# Patient Record
Sex: Female | Born: 1980 | Race: White | Hispanic: No | Marital: Married | Smoking: Never smoker
Health system: Southern US, Community
[De-identification: ages and names within clinical notes are randomized; demographics above are authoritative.]

## PROBLEM LIST (undated history)

## (undated) DIAGNOSIS — F419 Anxiety disorder, unspecified: Secondary | ICD-10-CM

## (undated) DIAGNOSIS — D6851 Activated protein C resistance: Secondary | ICD-10-CM

## (undated) DIAGNOSIS — F32A Depression, unspecified: Secondary | ICD-10-CM

## (undated) DIAGNOSIS — F329 Major depressive disorder, single episode, unspecified: Secondary | ICD-10-CM

## (undated) HISTORY — PX: INTRAUTERINE DEVICE (IUD) INSERTION: SHX5877

## (undated) HISTORY — PX: NO PAST SURGERIES: SHX2092

## (undated) HISTORY — DX: Anxiety disorder, unspecified: F41.9

## (undated) HISTORY — DX: Activated protein C resistance: D68.51

## (undated) HISTORY — DX: Depression, unspecified: F32.A

---
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
## 2018-08-08 ENCOUNTER — Encounter: Payer: Self-pay | Admitting: Family

## 2018-08-08 ENCOUNTER — Ambulatory Visit: Payer: BLUE CROSS/BLUE SHIELD | Admitting: Family

## 2018-08-08 VITALS — BP 102/78 | HR 78 | Temp 98.3°F | Ht 70.0 in | Wt 167.4 lb

## 2018-08-08 DIAGNOSIS — D6851 Activated protein C resistance: Secondary | ICD-10-CM | POA: Diagnosis not present

## 2018-08-08 DIAGNOSIS — F320 Major depressive disorder, single episode, mild: Secondary | ICD-10-CM

## 2018-08-08 DIAGNOSIS — F411 Generalized anxiety disorder: Secondary | ICD-10-CM | POA: Insufficient documentation

## 2018-08-08 MED ORDER — SERTRALINE HCL 50 MG PO TABS: 50.0000 mg | ORAL_TABLET | Freq: Every day | ORAL | 3 refills | Status: DC

## 2018-08-08 NOTE — Assessment & Plan Note (Signed)
Trial zoloft. Close follow up.

## 2018-08-08 NOTE — Patient Instructions (Addendum)
Trial on zoloft.   Very nice to meet you.   Our hope is for gradual improvement of mood since starting medication; however this may take several weeks.   If you start to have unusual thoughts, thoughts of hurting yourself, or anyone else, please go immediately to the emergency department.   Follow up in 6 -8 weeks.    National Suicide Prevention Hotline - available 24 hours a day, 7 days a week.  (931)563-7093  Major Depressive Disorder Major depressive disorder is a mental illness. It also may be called clinical depression or unipolar depression. Major depressive disorder usually causes feelings of sadness, hopelessness, or helplessness. Some people with this disorder do not feel particularly sad but lose interest in doing things they used to enjoy (anhedonia). Major depressive disorder also can cause physical symptoms. It can interfere with work, school, relationships, and other normal everyday activities. The disorder varies in severity but is longer lasting and more serious than the sadness we all feel from time to time in our lives. Major depressive disorder often is triggered by stressful life events or major life changes. Examples of these triggers include divorce, loss of your job or home, a move, and the death of a family member or close friend. Sometimes this disorder occurs for no obvious reason at all. People who have family members with major depressive disorder or bipolar disorder are at higher risk for developing this disorder, with or without life stressors. Major depressive disorder can occur at any age. It may occur just once in your life (single episode major depressive disorder). It may occur multiple times (recurrent major depressive disorder). SYMPTOMS People with major depressive disorder have either anhedonia or depressed mood on nearly a daily basis for at least 2 weeks or longer. Symptoms of depressed mood include:  Feelings of sadness (blue or down in the dumps) or  emptiness.  Feelings of hopelessness or helplessness.  Tearfulness or episodes of crying (may be observed by others).  Irritability (children and adolescents). In addition to depressed mood or anhedonia or both, people with this disorder have at least four of the following symptoms:  Difficulty sleeping or sleeping too much.   Significant change (increase or decrease) in appetite or weight.   Lack of energy or motivation.  Feelings of guilt and worthlessness.   Difficulty concentrating, remembering, or making decisions.  Unusually slow movement (psychomotor retardation) or restlessness (as observed by others).   Recurrent wishes for death, recurrent thoughts of self-harm (suicide), or a suicide attempt. People with major depressive disorder commonly have persistent negative thoughts about themselves, other people, and the world. People with severe major depressive disorder may experiencedistorted beliefs or perceptions about the world (psychotic delusions). They also may see or hear things that are not real (psychotic hallucinations). DIAGNOSIS Major depressive disorder is diagnosed through an assessment by your health care provider. Your health care provider will ask aboutaspects of your daily life, such as mood,sleep, and appetite, to see if you have the diagnostic symptoms of major depressive disorder. Your health care provider may ask about your medical history and use of alcohol or drugs, including prescription medicines. Your health care provider also may do a physical exam and blood work. This is because certain medical conditions and the use of certain substances can cause major depressive disorder-like symptoms (secondary depression). Your health care provider also may refer you to a mental health specialist for further evaluation and treatment. TREATMENT It is important to recognize the symptoms of major depressive  disorder and seek treatment. The following treatments can  be prescribed for this disorder:   Medicine. Antidepressant medicines usually are prescribed. Antidepressant medicines are thought to correct chemical imbalances in the brain that are commonly associated with major depressive disorder. Other types of medicine may be added if the symptoms do not respond to antidepressant medicines alone or if psychotic delusions or hallucinations occur.  Talk therapy. Talk therapy can be helpful in treating major depressive disorder by providing support, education, and guidance. Certain types of talk therapy also can help with negative thinking (cognitive behavioral therapy) and with relationship issues that trigger this disorder (interpersonal therapy). A mental health specialist can help determine which treatment is best for you. Most people with major depressive disorder do well with a combination of medicine and talk therapy. Treatments involving electrical stimulation of the brain can be used in situations with extremely severe symptoms or when medicine and talk therapy do not work over time. These treatments include electroconvulsive therapy, transcranial magnetic stimulation, and vagal nerve stimulation.   This information is not intended to replace advice given to you by your health care provider. Make sure you discuss any questions you have with your health care provider.   Document Released: 11/05/2012 Document Revised: 08/01/2014 Document Reviewed: 11/05/2012 Elsevier Interactive Patient Education [employer].

## 2018-08-08 NOTE — Progress Notes (Signed)
Subjective:    Patient ID: Julia Nolan, female    DOB: 03/29/1981, 38 y.o.   MRN: [id number]  CC: Julia Nolan is a 38 y.o. female who presents today to establish care.    HPI: Describes long history of depression.  Started inHer 35s when her husband was deployed, she first notieced that she was depressed.  At that time she was treated with Celexa, she thinks it was effective at that time.  She  never pursued any counseling.   No history of suicide attempts, hospitalizations for mental illness.  No current suicide ideation, or thoughts of hurting anyone else.   Trouble sleeping and she suspects her mind racing is part of that.  Husband starts MBA school in 1 month, and she knows it she will have more on her plate then.   No panic attacks.   History of factor V Leiden.  Father and brothers( 3) Factor V.  Currently taking a baby aspirin 10 mg once a day. ASA 81mg .  No bleeding, bruising.  No h/o DVT, PE.   No OCP, patient is non-smoker.         HISTORY:  History reviewed. No pertinent past medical history. History reviewed. No pertinent surgical history. Family History  Problem Relation Age of Onset   Depression Mother    Hyperlipidemia Mother    Hyperlipidemia Father    Depression Father    Cancer Father    Depression Sister    Stroke Maternal Grandmother    Arthritis Maternal Grandfather    Diabetes Maternal Grandfather    Hyperlipidemia Paternal Grandmother    Hyperlipidemia Paternal Grandfather    Hypertension Paternal Grandfather    Kidney disease Paternal Grandfather     Allergies: Patient has no known allergies. Current Outpatient Medications on File Prior to Visit  Medication Sig Dispense Refill   aspirin EC 81 MG tablet Take 81 mg by mouth daily.     Probiotic Product (PROBIOTIC-10 PO) Take by mouth.     No current facility-administered medications on file prior to visit.     Social History   Tobacco Use   Smoking status: Never Smoker    Smokeless tobacco: Never Used  Substance Use Topics   Alcohol use: Yes   Drug use: Never    Review of Systems  Constitutional: Negative for chills and fever.  Respiratory: Negative for cough.   Cardiovascular: Negative for chest pain and palpitations.  Gastrointestinal: Negative for nausea and vomiting.  Psychiatric/Behavioral: Positive for sleep disturbance. Negative for suicidal ideas. The patient is not nervous/anxious.       Objective:    BP 102/78 (BP Location: Left Arm, Patient Position: Sitting, Cuff Size: Normal)    Pulse 78    Temp 98.3 F (36.8 C)    Ht 5\' 10"  (1.778 m)    Wt 167 lb 6.4 oz (75.9 kg)    SpO2 97%    BMI 24.02 kg/m  BP Readings from Last 3 Encounters:  08/08/18 102/78   Wt Readings from Last 3 Encounters:  08/08/18 167 lb 6.4 oz (75.9 kg)    Physical Exam Vitals signs reviewed.  Constitutional:      Appearance: She is well-developed.  Eyes:     Conjunctiva/sclera: Conjunctivae normal.  Cardiovascular:     Rate and Rhythm: Normal rate and regular rhythm.     Pulses: Normal pulses.     Heart sounds: Normal heart sounds.  Pulmonary:     Effort: Pulmonary effort is normal.  Breath sounds: Normal breath sounds. No wheezing, rhonchi or rales.  Skin:    General: Skin is warm and dry.  Neurological:     Mental Status: She is alert.  Psychiatric:        Speech: Speech normal.        Behavior: Behavior normal.        Thought Content: Thought content normal.        Assessment & Plan:   Problem List Items Addressed This Visit      Hematopoietic and Hemostatic   Factor V Leiden mutation ([place])    Currently on 81mg  ASA. No h/o dvt, pe. Discussed lifestyle measures, healthy weight, avoidance long periods of inmobility. She is a nonsmoker. Declines hematology consult at this time.          Other   Depression, major, single episode, mild ([place]) - Primary    Trial zoloft. Close follow up.       Relevant Medications   sertraline (ZOLOFT)  50 MG tablet       I am having Julia Nolan start on sertraline. I am also having her maintain her aspirin EC and Probiotic Product (PROBIOTIC-10 PO).   Meds ordered this encounter  Medications   sertraline (ZOLOFT) 50 MG tablet    Sig: Take 1 tablet (50 mg total) by mouth at bedtime.    Dispense:  90 tablet    Refill:  3    Order Specific Question:   Supervising Provider    Answer:   Julia Nolan [[id number]]    Return precautions given.   Risks, benefits, and alternatives of the medications and treatment plan prescribed today were discussed, and patient expressed understanding.   Education regarding symptom management and diagnosis given to patient on AVS.  Continue to follow with Julia Hawthorne, FNP for routine health maintenance.   Julia Nolan and I agreed with plan.   Julia Paris, FNP

## 2018-08-08 NOTE — Assessment & Plan Note (Signed)
Currently on 81mg  ASA. No h/o dvt, pe. Discussed lifestyle measures, healthy weight, avoidance long periods of inmobility. She is a nonsmoker. Declines hematology consult at this time.

## 2018-08-30 ENCOUNTER — Other Ambulatory Visit: Payer: Self-pay | Admitting: Family

## 2018-08-30 DIAGNOSIS — F320 Major depressive disorder, single episode, mild: Secondary | ICD-10-CM

## 2018-09-12 ENCOUNTER — Ambulatory Visit: Payer: BLUE CROSS/BLUE SHIELD | Admitting: Internal Medicine

## 2018-09-12 ENCOUNTER — Encounter: Payer: Self-pay | Admitting: Internal Medicine

## 2018-09-12 VITALS — BP 126/68 | HR 71 | Temp 98.5°F | Ht 70.0 in | Wt 165.6 lb

## 2018-09-12 DIAGNOSIS — J069 Acute upper respiratory infection, unspecified: Secondary | ICD-10-CM

## 2018-09-12 DIAGNOSIS — J329 Chronic sinusitis, unspecified: Secondary | ICD-10-CM | POA: Diagnosis not present

## 2018-09-12 DIAGNOSIS — J029 Acute pharyngitis, unspecified: Secondary | ICD-10-CM

## 2018-09-12 DIAGNOSIS — R6889 Other general symptoms and signs: Secondary | ICD-10-CM

## 2018-09-12 LAB — POC INFLUENZA A&B (BINAX/QUICKVUE)
Influenza A, POC: NEGATIVE
Influenza B, POC: NEGATIVE

## 2018-09-12 MED ORDER — HYDROCOD POLST-CPM POLST ER 10-8 MG/5ML PO SUER: 5.0000 mL | Freq: Every evening | ORAL | 0 refills | Status: DC | PRN

## 2018-09-12 MED ORDER — AZITHROMYCIN 250 MG PO TABS: ORAL_TABLET | ORAL | 0 refills | Status: DC

## 2018-09-12 NOTE — Progress Notes (Signed)
Pre visit review using our clinic review tool, if applicable. No additional management support is needed unless otherwise documented below in the visit note. °

## 2018-09-12 NOTE — Patient Instructions (Signed)
Sore Throat A sore throat is pain, burning, irritation, or scratchiness in the throat. When you have a sore throat, you may feel pain or tenderness in your throat when you swallow or talk. Many things can cause a sore throat, including:  An infection.  Seasonal allergies.  Dryness in the air.  Irritants, such as smoke or pollution.  Radiation treatment to the area.  Gastroesophageal reflux disease (GERD).  A tumor. A sore throat is often the first sign of another sickness. It may happen with other symptoms, such as coughing, sneezing, fever, and swollen neck glands. Most sore throats go away without medical treatment. Follow these instructions at home:      Take over-the-counter medicines only as told by your health care provider. ? If your child has a sore throat, do not give your child aspirin because of the association with Reye syndrome.  Drink enough fluids to keep your urine pale yellow.  Rest as needed.  To help with pain, try: ? Sipping warm liquids, such as broth, herbal tea, or warm water. ? Eating or drinking cold or frozen liquids, such as frozen ice pops. ? Gargling with a salt-water mixture 3-4 times a day or as needed. To make a salt-water mixture, completely dissolve -1 tsp (3-6 g) of salt in 1 cup (237 mL) of warm water. ? Sucking on hard candy or throat lozenges. ? Putting a cool-mist humidifier in your bedroom at night to moisten the air. ? Sitting in the bathroom with the door closed for 5-10 minutes while you run hot water in the shower.  Do not use any products that contain nicotine or tobacco, such as cigarettes, e-cigarettes, and chewing tobacco. If you need help quitting, ask your health care provider.  Wash your hands well and often with soap and water. If soap and water are not available, use hand sanitizer. Contact a health care provider if:  You have a fever for more than 2-3 days.  You have symptoms that last (are persistent) for more than  2-3 days.  Your throat does not get better within 7 days.  You have a fever and your symptoms suddenly get worse.  Your child who is 3 months to 5 years old has a temperature of 102.57F (39C) or higher. Get help right away if:  You have difficulty breathing.  You cannot swallow fluids, soft foods, or your saliva.  You have increased swelling in your throat or neck.  You have persistent nausea and vomiting. Summary  A sore throat is pain, burning, irritation, or scratchiness in the throat. Many things can cause a sore throat.  Take over-the-counter medicines only as told by your health care provider. Do not give your child aspirin.  Drink plenty of fluids, and rest as needed.  Contact a health care provider if your symptoms worsen or your sore throat does not get better within 7 days. This information is not intended to replace advice given to you by your health care provider. Make sure you discuss any questions you have with your health care provider. Document Released: 08/18/2004 Document Revised: 12/11/2017 Document Reviewed: 12/11/2017 Elsevier Interactive Patient Education  2019 [employer]  Sinusitis, Adult Sinusitis is inflammation of your sinuses. Sinuses are hollow spaces in the bones around your face. Your sinuses are located:  Around your eyes.  In the middle of your forehead.  Behind your nose.  In your cheekbones. Mucus normally drains out of your sinuses. When your nasal tissues become inflamed or swollen, mucus  can become trapped or blocked. This allows bacteria, viruses, and fungi to grow, which leads to infection. Most infections of the sinuses are caused by a virus. Sinusitis can develop quickly. It can last for up to 4 weeks (acute) or for more than 12 weeks (chronic). Sinusitis often develops after a cold. What are the causes? This condition is caused by anything that creates swelling in the sinuses or stops mucus from draining. This  includes:  Allergies.  Asthma.  Infection from bacteria or viruses.  Deformities or blockages in your nose or sinuses.  Abnormal growths in the nose (nasal polyps).  Pollutants, such as chemicals or irritants in the air.  Infection from fungi (rare). What increases the risk? You are more likely to develop this condition if you:  Have a weak body defense system (immune system).  Do a lot of swimming or diving.  Overuse nasal sprays.  Smoke. What are the signs or symptoms? The main symptoms of this condition are pain and a feeling of pressure around the affected sinuses. Other symptoms include:  Stuffy nose or congestion.  Thick drainage from your nose.  Swelling and warmth over the affected sinuses.  Headache.  Upper toothache.  A cough that may get worse at night.  Extra mucus that collects in the throat or the back of the nose (postnasal drip).  Decreased sense of smell and taste.  Fatigue.  A fever.  Sore throat.  Bad breath. How is this diagnosed? This condition is diagnosed based on:  Your symptoms.  Your medical history.  A physical exam.  Tests to find out if your condition is acute or chronic. This may include: ? Checking your nose for nasal polyps. ? Viewing your sinuses using a device that has a light (endoscope). ? Testing for allergies or bacteria. ? Imaging tests, such as an MRI or CT scan. In rare cases, a bone biopsy may be done to rule out more serious types of fungal sinus disease. How is this treated? Treatment for sinusitis depends on the cause and whether your condition is chronic or acute.  If caused by a virus, your symptoms should go away on their own within 10 days. You may be given medicines to relieve symptoms. They include: ? Medicines that shrink swollen nasal passages (topical intranasal decongestants). ? Medicines that treat allergies (antihistamines). ? A spray that eases inflammation of the nostrils (topical  intranasal corticosteroids). ? Rinses that help get rid of thick mucus in your nose (nasal saline washes).  If caused by bacteria, your health care provider may recommend waiting to see if your symptoms improve. Most bacterial infections will get better without antibiotic medicine. You may be given antibiotics if you have: ? A severe infection. ? A weak immune system.  If caused by narrow nasal passages or nasal polyps, you may need to have surgery. Follow these instructions at home: Medicines  Take, use, or apply over-the-counter and prescription medicines only as told by your health care provider. These may include nasal sprays.  If you were prescribed an antibiotic medicine, take it as told by your health care provider. Do not stop taking the antibiotic even if you start to feel better. Hydrate and humidify   Drink enough fluid to keep your urine pale yellow. Staying hydrated will help to thin your mucus.  Use a cool mist humidifier to keep the humidity level in your home above 50%.  Inhale steam for 10-15 minutes, 3-4 times a day, or as told by your  health care provider. You can do this in the bathroom while a hot shower is running.  Limit your exposure to cool or dry air. Rest  Rest as much as possible.  Sleep with your head raised (elevated).  Make sure you get enough sleep each night. General instructions   Apply a warm, moist washcloth to your face 3-4 times a day or as told by your health care provider. This will help with discomfort.  Wash your hands often with soap and water to reduce your exposure to germs. If soap and water are not available, use hand sanitizer.  Do not smoke. Avoid being around people who are smoking (secondhand smoke).  Keep all follow-up visits as told by your health care provider. This is important. Contact a health care provider if:  You have a fever.  Your symptoms get worse.  Your symptoms do not improve within 10 days. Get help  right away if:  You have a severe headache.  You have persistent vomiting.  You have severe pain or swelling around your face or eyes.  You have vision problems.  You develop confusion.  Your neck is stiff.  You have trouble breathing. Summary  Sinusitis is soreness and inflammation of your sinuses. Sinuses are hollow spaces in the bones around your face.  This condition is caused by nasal tissues that become inflamed or swollen. The swelling traps or blocks the flow of mucus. This allows bacteria, viruses, and fungi to grow, which leads to infection.  If you were prescribed an antibiotic medicine, take it as told by your health care provider. Do not stop taking the antibiotic even if you start to feel better.  Keep all follow-up visits as told by your health care provider. This is important. This information is not intended to replace advice given to you by your health care provider. Make sure you discuss any questions you have with your health care provider. Document Released: 07/11/2005 Document Revised: 12/11/2017 Document Reviewed: 12/11/2017 Elsevier Interactive Patient Education  2019 [employer]  Sinusitis, Adult Sinusitis is inflammation of your sinuses. Sinuses are hollow spaces in the bones around your face. Your sinuses are located:  Around your eyes.  In the middle of your forehead.  Behind your nose.  In your cheekbones. Mucus normally drains out of your sinuses. When your nasal tissues become inflamed or swollen, mucus can become trapped or blocked. This allows bacteria, viruses, and fungi to grow, which leads to infection. Most infections of the sinuses are caused by a virus. Sinusitis can develop quickly. It can last for up to 4 weeks (acute) or for more than 12 weeks (chronic). Sinusitis often develops after a cold. What are the causes? This condition is caused by anything that creates swelling in the sinuses or stops mucus from draining. This  includes:  Allergies.  Asthma.  Infection from bacteria or viruses.  Deformities or blockages in your nose or sinuses.  Abnormal growths in the nose (nasal polyps).  Pollutants, such as chemicals or irritants in the air.  Infection from fungi (rare). What increases the risk? You are more likely to develop this condition if you:  Have a weak body defense system (immune system).  Do a lot of swimming or diving.  Overuse nasal sprays.  Smoke. What are the signs or symptoms? The main symptoms of this condition are pain and a feeling of pressure around the affected sinuses. Other symptoms include:  Stuffy nose or congestion.  Thick drainage from your nose.  Swelling and warmth over the affected sinuses.  Headache.  Upper toothache.  A cough that may get worse at night.  Extra mucus that collects in the throat or the back of the nose (postnasal drip).  Decreased sense of smell and taste.  Fatigue.  A fever.  Sore throat.  Bad breath. How is this diagnosed? This condition is diagnosed based on:  Your symptoms.  Your medical history.  A physical exam.  Tests to find out if your condition is acute or chronic. This may include: ? Checking your nose for nasal polyps. ? Viewing your sinuses using a device that has a light (endoscope). ? Testing for allergies or bacteria. ? Imaging tests, such as an MRI or CT scan. In rare cases, a bone biopsy may be done to rule out more serious types of fungal sinus disease. How is this treated? Treatment for sinusitis depends on the cause and whether your condition is chronic or acute.  If caused by a virus, your symptoms should go away on their own within 10 days. You may be given medicines to relieve symptoms. They include: ? Medicines that shrink swollen nasal passages (topical intranasal decongestants). ? Medicines that treat allergies (antihistamines). ? A spray that eases inflammation of the nostrils (topical  intranasal corticosteroids). ? Rinses that help get rid of thick mucus in your nose (nasal saline washes).  If caused by bacteria, your health care provider may recommend waiting to see if your symptoms improve. Most bacterial infections will get better without antibiotic medicine. You may be given antibiotics if you have: ? A severe infection. ? A weak immune system.  If caused by narrow nasal passages or nasal polyps, you may need to have surgery. Follow these instructions at home: Medicines  Take, use, or apply over-the-counter and prescription medicines only as told by your health care provider. These may include nasal sprays.  If you were prescribed an antibiotic medicine, take it as told by your health care provider. Do not stop taking the antibiotic even if you start to feel better. Hydrate and humidify   Drink enough fluid to keep your urine pale yellow. Staying hydrated will help to thin your mucus.  Use a cool mist humidifier to keep the humidity level in your home above 50%.  Inhale steam for 10-15 minutes, 3-4 times a day, or as told by your health care provider. You can do this in the bathroom while a hot shower is running.  Limit your exposure to cool or dry air. Rest  Rest as much as possible.  Sleep with your head raised (elevated).  Make sure you get enough sleep each night. General instructions   Apply a warm, moist washcloth to your face 3-4 times a day or as told by your health care provider. This will help with discomfort.  Wash your hands often with soap and water to reduce your exposure to germs. If soap and water are not available, use hand sanitizer.  Do not smoke. Avoid being around people who are smoking (secondhand smoke).  Keep all follow-up visits as told by your health care provider. This is important. Contact a health care provider if:  You have a fever.  Your symptoms get worse.  Your symptoms do not improve within 10 days. Get help  right away if:  You have a severe headache.  You have persistent vomiting.  You have severe pain or swelling around your face or eyes.  You have vision problems.  You develop confusion.  Your neck is stiff.  You have trouble breathing. Summary  Sinusitis is soreness and inflammation of your sinuses. Sinuses are hollow spaces in the bones around your face.  This condition is caused by nasal tissues that become inflamed or swollen. The swelling traps or blocks the flow of mucus. This allows bacteria, viruses, and fungi to grow, which leads to infection.  If you were prescribed an antibiotic medicine, take it as told by your health care provider. Do not stop taking the antibiotic even if you start to feel better.  Keep all follow-up visits as told by your health care provider. This is important. This information is not intended to replace advice given to you by your health care provider. Make sure you discuss any questions you have with your health care provider. Document Released: 07/11/2005 Document Revised: 12/11/2017 Document Reviewed: 12/11/2017 Elsevier Interactive Patient Education  2019 [employer].

## 2018-09-12 NOTE — Progress Notes (Signed)
Chief Complaint  Patient presents with  . Cough  . URI   F/u >1.5 weeks c/o cough, body aches, stuffy nose, h/a, hoarse voice last week now better Monday Sunday achy/h/a Advil helps. Also c/o sore/scratchy throat/PND. No sinus pressure last week tried Mucinex, delsym, honey cough syrup not helping. No h/o asthma    Review of Systems  Constitutional: Negative for fever.  HENT: Positive for sinus pain and sore throat. Negative for hearing loss.   Eyes: Negative for blurred vision.  Respiratory: Positive for cough and sputum production. Negative for wheezing.   Cardiovascular: Negative for chest pain.  Musculoskeletal: Positive for myalgias.  Skin: Negative for rash.   History reviewed. No pertinent past medical history. History reviewed. No pertinent surgical history. Family History  Problem Relation Age of Onset  . Depression Mother   . Hyperlipidemia Mother   . Hyperlipidemia Father   . Depression Father   . Cancer Father   . Depression Sister   . Stroke Maternal Grandmother   . Arthritis Maternal Grandfather   . Diabetes Maternal Grandfather   . Hyperlipidemia Paternal Grandmother   . Hyperlipidemia Paternal Grandfather   . Hypertension Paternal Grandfather   . Kidney disease Paternal Grandfather    Social History   Socioeconomic History  . Marital status: Married    Spouse name: Ailene Laukaitis  . Number of children: 3  . Years of education: Not on file  . Highest education level: Bachelor's degree (e.g., BA, AB, BS)  Occupational History  . Not on file  Social Needs  . Financial resource strain: Not on file  . Food insecurity:    Worry: Not on file    Inability: Not on file  . Transportation needs:    Medical: Not on file    Non-medical: Not on file  Tobacco Use  . Smoking status: Never Smoker  . Smokeless tobacco: Never Used  Substance and Sexual Activity  . Alcohol use: Yes  . Drug use: Never  . Sexual activity: Yes  Lifestyle  . Physical activity:     Days per week: Not on file    Minutes per session: Not on file  . Stress: Not on file  Relationships  . Social connections:    Talks on phone: Not on file    Gets together: Not on file    Attends religious service: Not on file    Active member of club or organization: Not on file    Attends meetings of clubs or organizations: Not on file    Relationship status: Not on file  . Intimate partner violence:    Fear of current or ex partner: Not on file    Emotionally abused: Not on file    Physically abused: Not on file    Forced sexual activity: Not on file  Other Topics Concern  . Not on file  Social History Narrative   Moved here from [place].       Stay at home mom      8, 5, 4- boys.    Current Meds  Medication Sig  . aspirin EC 81 MG tablet Take 81 mg by mouth daily.  . Probiotic Product (PROBIOTIC-10 PO) Take by mouth.  . sertraline (ZOLOFT) 50 MG tablet TAKE 1 TABLET BY MOUTH EVERYDAY AT BEDTIME   No Known Allergies Recent Results (from the past 2160 hour(s))  POC Influenza A&B(BINAX/QUICKVUE)     Status: None   Collection Time: 09/12/18 11:47 AM  Result Value Ref Range  Influenza A, POC Negative Negative   Influenza B, POC Negative Negative   Objective  Body mass index is 23.76 kg/m. Wt Readings from Last 3 Encounters:  09/12/18 165 lb 9.6 oz (75.1 kg)  08/08/18 167 lb 6.4 oz (75.9 kg)   Temp Readings from Last 3 Encounters:  09/12/18 98.5 F (36.9 C) (Oral)  08/08/18 98.3 F (36.8 C)   BP Readings from Last 3 Encounters:  09/12/18 126/68  08/08/18 102/78   Pulse Readings from Last 3 Encounters:  09/12/18 71  08/08/18 78    Physical Exam Vitals signs and nursing note reviewed.  Constitutional:      Appearance: Normal appearance. She is well-developed and well-groomed.  HENT:     Head: Normocephalic and atraumatic.     Nose: Nose normal.     Mouth/Throat:     Mouth: Mucous membranes are moist.     Pharynx: Oropharynx is clear. Posterior  oropharyngeal erythema present.  Eyes:     Conjunctiva/sclera: Conjunctivae normal.     Pupils: Pupils are equal, round, and reactive to light.  Cardiovascular:     Rate and Rhythm: Normal rate and regular rhythm.     Heart sounds: Normal heart sounds.  Pulmonary:     Effort: Pulmonary effort is normal.     Breath sounds: Normal breath sounds. No wheezing.  Skin:    General: Skin is warm and dry.  Neurological:     General: No focal deficit present.     Mental Status: She is alert and oriented to person, place, and time. Mental status is at baseline.     Gait: Gait normal.  Psychiatric:        Attention and Perception: Attention and perception normal.        Mood and Affect: Mood and affect normal.        Speech: Speech normal.        Behavior: Behavior normal. Behavior is cooperative.        Thought Content: Thought content normal.        Cognition and Memory: Cognition and memory normal.        Judgment: Judgment normal.     Assessment   1. Uri/sinusitis/sore throat with sick contacts at home flu negative Plan  1. Zpack, tussionex, supportive care cold handout given  Provider: Dr. French Ana McLean-Scocuzza-[facility]

## 2018-10-08 ENCOUNTER — Encounter: Payer: BLUE CROSS/BLUE SHIELD | Admitting: Family

## 2018-10-24 ENCOUNTER — Other Ambulatory Visit: Payer: Self-pay | Admitting: Family

## 2018-10-24 DIAGNOSIS — F320 Major depressive disorder, single episode, mild: Secondary | ICD-10-CM

## 2018-10-26 ENCOUNTER — Other Ambulatory Visit: Payer: Self-pay | Admitting: Family

## 2018-10-26 DIAGNOSIS — F320 Major depressive disorder, single episode, mild: Secondary | ICD-10-CM

## 2018-11-01 ENCOUNTER — Encounter: Payer: Self-pay | Admitting: Family

## 2018-11-08 ENCOUNTER — Telehealth: Payer: Self-pay | Admitting: Family

## 2018-11-08 DIAGNOSIS — F320 Major depressive disorder, single episode, mild: Secondary | ICD-10-CM

## 2018-11-08 NOTE — Telephone Encounter (Signed)
Patient is requesting a refill on Zoloft 50 mg tablets.  Kailia Starry,cma

## 2018-11-08 NOTE — Telephone Encounter (Signed)
Pt is requesting a refill on sertraline (ZOLOFT) 50 MG tablet.

## 2018-11-09 MED ORDER — SERTRALINE HCL 50 MG PO TABS: ORAL_TABLET | ORAL | 3 refills | Status: DC

## 2018-11-12 ENCOUNTER — Encounter: Payer: BLUE CROSS/BLUE SHIELD | Admitting: Family

## 2019-01-21 ENCOUNTER — Encounter: Payer: BLUE CROSS/BLUE SHIELD | Admitting: Family

## 2019-01-23 ENCOUNTER — Other Ambulatory Visit: Payer: Self-pay

## 2019-02-03 ENCOUNTER — Other Ambulatory Visit: Payer: Self-pay | Admitting: Family

## 2019-02-03 DIAGNOSIS — F320 Major depressive disorder, single episode, mild: Secondary | ICD-10-CM

## 2019-02-04 ENCOUNTER — Ambulatory Visit (INDEPENDENT_AMBULATORY_CARE_PROVIDER_SITE_OTHER): Payer: BC Managed Care – PPO | Admitting: Family

## 2019-02-04 ENCOUNTER — Encounter: Payer: Self-pay | Admitting: Family

## 2019-02-04 ENCOUNTER — Other Ambulatory Visit (HOSPITAL_COMMUNITY)
Admission: RE | Admit: 2019-02-04 | Discharge: 2019-02-04 | Disposition: A | Discharge status: Home / Self Care | Payer: BC Managed Care – PPO | Source: Ambulatory Visit | Attending: Family | Admitting: Family

## 2019-02-04 ENCOUNTER — Other Ambulatory Visit: Payer: Self-pay

## 2019-02-04 VITALS — BP 122/78 | HR 63 | Temp 98.4°F | Ht 70.0 in | Wt 169.8 lb

## 2019-02-04 DIAGNOSIS — Z0000 Encounter for general adult medical examination without abnormal findings: Secondary | ICD-10-CM

## 2019-02-04 DIAGNOSIS — D6851 Activated protein C resistance: Secondary | ICD-10-CM | POA: Diagnosis not present

## 2019-02-04 DIAGNOSIS — F320 Major depressive disorder, single episode, mild: Secondary | ICD-10-CM

## 2019-02-04 MED ORDER — SERTRALINE HCL 100 MG PO TABS: 100.0000 mg | ORAL_TABLET | Freq: Every day | ORAL | 1 refills | Status: DC

## 2019-02-04 NOTE — Assessment & Plan Note (Signed)
Consult with hematology to ensure she needs to be on ASA ( ? Lifetime). Will follow

## 2019-02-04 NOTE — Patient Instructions (Addendum)
Trial of zoloft 100mg   We placed a referral for mammogram this year. I asked that you call one the below locations and schedule this when it is convenient for you.   As discussed, I would like you to ask for 3D mammogram over the traditional 2D mammogram as new evidence suggest 3D is superior.    [facility]  [address]  [place], [place]  [phone]   Today we discussed referrals, orders. hematology   I have placed these orders in the system for you.  Please be sure to give [place] a call if you have not heard from our office regarding this. We should hear from [place] within ONE week with information regarding your appointment. If not, please let me know immediately.   Stay safe!   Health Maintenance, Female Adopting a healthy lifestyle and getting preventive care are important in promoting health and wellness. Ask your health care provider about:  The right schedule for you to have regular tests and exams.  Things you can do on your own to prevent diseases and keep yourself healthy. What should I know about diet, weight, and exercise? Eat a healthy diet   Eat a diet that includes plenty of vegetables, fruits, low-fat dairy products, and lean protein.  Do not eat a lot of foods that are high in solid fats, added sugars, or sodium. Maintain a healthy weight Body mass index (BMI) is used to identify weight problems. It estimates body fat based on height and weight. Your health care provider can help determine your BMI and help you achieve or maintain a healthy weight. Get regular exercise Get regular exercise. This is one of the most important things you can do for your health. Most adults should:  Exercise for at least 150 minutes each week. The exercise should increase your heart rate and make you sweat (moderate-intensity exercise).  Do strengthening exercises at least twice a week. This is in addition to the moderate-intensity exercise.  Spend less time  sitting. Even light physical activity can be beneficial. Watch cholesterol and blood lipids Have your blood tested for lipids and cholesterol at 39 years of age, then have this test every 5 years. Have your cholesterol levels checked more often if:  Your lipid or cholesterol levels are high.  You are older than 39 years of age.  You are at high risk for heart disease. What should I know about cancer screening? Depending on your health history and family history, you may need to have cancer screening at various ages. This may include screening for:  Breast cancer.  Cervical cancer.  Colorectal cancer.  Skin cancer.  Lung cancer. What should I know about heart disease, diabetes, and high blood pressure? Blood pressure and heart disease  High blood pressure causes heart disease and increases the risk of stroke. This is more likely to develop in people who have high blood pressure readings, are of African descent, or are overweight.  Have your blood pressure checked: ? Every 3-5 years if you are 2-6 years of age. ? Every year if you are 20 years old or older. Diabetes Have regular diabetes screenings. This checks your fasting blood sugar level. Have the screening done:  Once every three years after age 67 if you are at a normal weight and have a low risk for diabetes.  More often and at a younger age if you are overweight or have a high risk for diabetes. What should I know about preventing infection?  Hepatitis B If you have a higher risk for hepatitis B, you should be screened for this virus. Talk with your health care provider to find out if you are at risk for hepatitis B infection. Hepatitis C Testing is recommended for:  Everyone born from [birth year] through 1966.  Anyone with known risk factors for hepatitis C. Sexually transmitted infections (STIs)  Get screened for STIs, including gonorrhea and chlamydia, if: ? You are sexually active and are younger than 39 years of  age. ? You are older than 39 years of age and your health care provider tells you that you are at risk for this type of infection. ? Your sexual activity has changed since you were last screened, and you are at increased risk for chlamydia or gonorrhea. Ask your health care provider if you are at risk.  Ask your health care provider about whether you are at high risk for HIV. Your health care provider may recommend a prescription medicine to help prevent HIV infection. If you choose to take medicine to prevent HIV, you should first get tested for HIV. You should then be tested every 3 months for as long as you are taking the medicine. Pregnancy  If you are about to stop having your period (premenopausal) and you may become pregnant, seek counseling before you get pregnant.  Take 400 to 800 micrograms (mcg) of folic acid every day if you become pregnant.  Ask for birth control (contraception) if you want to prevent pregnancy. Osteoporosis and menopause Osteoporosis is a disease in which the bones lose minerals and strength with aging. This can result in bone fractures. If you are 22 years old or older, or if you are at risk for osteoporosis and fractures, ask your health care provider if you should:  Be screened for bone loss.  Take a calcium or vitamin D supplement to lower your risk of fractures.  Be given hormone replacement therapy (HRT) to treat symptoms of menopause. Follow these instructions at home: Lifestyle  Do not use any products that contain nicotine or tobacco, such as cigarettes, e-cigarettes, and chewing tobacco. If you need help quitting, ask your health care provider.  Do not use street drugs.  Do not share needles.  Ask your health care provider for help if you need support or information about quitting drugs. Alcohol use  Do not drink alcohol if: ? Your health care provider tells you not to drink. ? You are pregnant, may be pregnant, or are planning to become  pregnant.  If you drink alcohol: ? Limit how much you use to 0-1 drink a day. ? Limit intake if you are breastfeeding.  Be aware of how much alcohol is in your drink. In the [place] one drink equals one 12 oz bottle of beer (355 mL), one 5 oz glass of wine (148 mL), or one 1 oz glass of hard liquor (44 mL). General instructions  Schedule regular health, dental, and eye exams.  Stay current with your vaccines.  Tell your health care provider if: ? You often feel depressed. ? You have ever been abused or do not feel safe at home. Summary  Adopting a healthy lifestyle and getting preventive care are important in promoting health and wellness.  Follow your health care provider's instructions about healthy diet, exercising, and getting tested or screened for diseases.  Follow your health care provider's instructions on monitoring your cholesterol and blood pressure. This information is not intended to replace advice given to you by your  health care provider. Make sure you discuss any questions you have with your health care provider. Document Released: 01/24/2011 Document Revised: 07/04/2018 Document Reviewed: 07/04/2018 Elsevier Patient Education  2020 [employer].

## 2019-02-04 NOTE — Progress Notes (Signed)
Pre visit review using our clinic review tool, if applicable. No additional management support is needed unless otherwise documented below in the visit note. °

## 2019-02-04 NOTE — Progress Notes (Signed)
Subjective:    Patient ID: Julia Nolan, female    DOB: 1981/04/28, 38 y.o.   MRN: [id number]  CC: Julia Nolan is a 38 y.o. female who presents today for physical exam.    HPI: Feels well. No complaints.    Depression- doing well. Taking 75 mg qam zoloft and feels better on medication.   On ASA- not sure if needs to be on for life.   Colorectal Cancer Screening: No early family history.  Breast Cancer Screening: due for baseline Cervical Cancer Screening: due. No ho abnormal pap. Feels 'moody when ovulating.' Nausea on occasion as well during first 3 days. No vomiting.  Bone Health screening/DEXA for 65+: No increased fracture risk. Defer screening at this time. Lung Cancer Screening: Doesn't have 30 year pack year history and age > 35 years. Immunizations        Tetanus - utd       HIV Screening- Candidate for , declines Labs: Screening labs today. Exercise: Gets regular exercise.  Alcohol use: rare Smoking/tobacco use: never smoker.  Regular dental exams: UTD Wears seat belt: Yes. Skin: no h/o skin cancer.   HISTORY:  History reviewed. No pertinent past medical history.  History reviewed. No pertinent surgical history. Family History  Problem Relation Age of Onset   Depression Mother    Hyperlipidemia Mother    Hyperlipidemia Father    Depression Father    Prostate cancer Father    Depression Sister    Stroke Maternal Grandmother    Arthritis Maternal Grandfather    Diabetes Maternal Grandfather    Hyperlipidemia Paternal Grandmother    Hyperlipidemia Paternal Grandfather    Hypertension Paternal Grandfather    Kidney disease Paternal Grandfather    Colon cancer Neg Hx    Breast cancer Neg Hx       ALLERGIES: Patient has no known allergies.  Current Outpatient Medications on File Prior to Visit  Medication Sig Dispense Refill   aspirin EC 81 MG tablet Take 81 mg by mouth daily.     Probiotic Product (PROBIOTIC-10 PO) Take by mouth.      raNITIdine HCl (WAL-ZAN 75 PO) Zantac 75  once a day     No current facility-administered medications on file prior to visit.     Social History   Tobacco Use   Smoking status: Never Smoker   Smokeless tobacco: Never Used  Substance Use Topics   Alcohol use: Yes   Drug use: Never    Review of Systems  Constitutional: Negative for chills, fever and unexpected weight change.  HENT: Negative for congestion.   Respiratory: Negative for cough.   Cardiovascular: Negative for chest pain, palpitations and leg swelling.  Gastrointestinal: Negative for nausea and vomiting.  Musculoskeletal: Negative for arthralgias and myalgias.  Skin: Negative for rash.  Neurological: Negative for headaches.  Hematological: Negative for adenopathy.  Psychiatric/Behavioral: Negative for confusion.      Objective:    BP 122/78    Pulse 63    Temp 98.4 F (36.9 C) (Oral)    Ht 5\' 10"  (1.778 m)    Wt 169 lb 12.8 oz (77 kg)    SpO2 97%    BMI 24.36 kg/m   BP Readings from Last 3 Encounters:  02/04/19 122/78  09/12/18 126/68  08/08/18 102/78   Wt Readings from Last 3 Encounters:  02/04/19 169 lb 12.8 oz (77 kg)  09/12/18 165 lb 9.6 oz (75.1 kg)  08/08/18 167 lb 6.4 oz (75.9 kg)  Physical Exam Vitals signs reviewed.  Constitutional:      Appearance: She is well-developed.  Eyes:     Conjunctiva/sclera: Conjunctivae normal.  Neck:     Thyroid: No thyroid mass or thyromegaly.  Cardiovascular:     Rate and Rhythm: Normal rate and regular rhythm.     Pulses: Normal pulses.     Heart sounds: Normal heart sounds.  Pulmonary:     Effort: Pulmonary effort is normal.     Breath sounds: Normal breath sounds. No wheezing, rhonchi or rales.  Chest:     Breasts: Breasts are symmetrical.        Right: No inverted nipple, mass, nipple discharge, skin change or tenderness.        Left: No inverted nipple, mass, nipple discharge, skin change or tenderness.  Genitourinary:    Cervix: No  cervical motion tenderness, discharge or friability.     Uterus: Not enlarged, not fixed and not tender.      Adnexa:        Right: No mass, tenderness or fullness.         Left: No mass, tenderness or fullness.       Comments: Pap performed. No CMT. Unable to appreciated ovaries. Lymphadenopathy:     Head:     Right side of head: No submental, submandibular, tonsillar, preauricular, posterior auricular or occipital adenopathy.     Left side of head: No submental, submandibular, tonsillar, preauricular, posterior auricular or occipital adenopathy.     Cervical:     Right cervical: No superficial, deep or posterior cervical adenopathy.    Left cervical: No superficial, deep or posterior cervical adenopathy.     Upper Body:     Right upper body: No pectoral adenopathy.     Left upper body: No pectoral adenopathy.  Skin:    General: Skin is warm and dry.  Neurological:     Mental Status: She is alert.  Psychiatric:        Speech: Speech normal.        Behavior: Behavior normal.        Thought Content: Thought content normal.        Assessment & Plan:   Problem List Items Addressed This Visit      Hematopoietic and Hemostatic   Factor V Leiden mutation [facility])    Consult with hematology to ensure she needs to be on ASA ( ? Lifetime). Will follow         Other   Depression, major, single episode, mild (HCC)    Some irritability pre menses. Agreed trial of zoloft 100mg . Patient will let me know how she is doing.       Relevant Medications   sertraline (ZOLOFT) 100 MG tablet   Routine physical examination - Primary    CBE and pap performed. Baseline mammogram ordered; patient will schedule.       Relevant Orders   Ambulatory referral to Hematology   Cytology - PAP   TSH   CBC with Differential/Platelet   Comprehensive metabolic panel   Hemoglobin A1c   Lipid panel   VITAMIN D 25 Hydroxy (Vit-D Deficiency, Fractures)   MM 3D SCREEN BREAST BILATERAL   Ambulatory  referral to Dermatology       I have discontinued Vanassa Bacote's chlorpheniramine-HYDROcodone and azithromycin. I have also changed her sertraline. Additionally, I am having her maintain her aspirin EC, Probiotic Product (PROBIOTIC-10 PO), and raNITIdine HCl (WAL-ZAN 75 PO).   Meds ordered this encounter  Medications   sertraline (ZOLOFT) 100 MG tablet    Sig: Take 1 tablet (100 mg total) by mouth daily.    Dispense:  90 tablet    Refill:  1    Order Specific Question:   Supervising Provider    Answer:   Crecencio Mc [[id number]]    Return precautions given.   Risks, benefits, and alternatives of the medications and treatment plan prescribed today were discussed, and patient expressed understanding.   Education regarding symptom management and diagnosis given to patient on AVS.   Continue to follow with Burnard Hawthorne, FNP for routine health maintenance.   [name] and I agreed with plan.   Mable Paris, FNP

## 2019-02-04 NOTE — Assessment & Plan Note (Signed)
Some irritability pre menses. Agreed trial of zoloft 100mg . Patient will let me know how she is doing.

## 2019-02-04 NOTE — Assessment & Plan Note (Signed)
CBE and pap performed. Baseline mammogram ordered; patient will schedule.

## 2019-02-05 LAB — COMPREHENSIVE METABOLIC PANEL
ALT: 25 U/L (ref 0–35)
AST: 24 U/L (ref 0–37)
Albumin: 4.5 g/dL (ref 3.5–5.2)
Alkaline Phosphatase: 79 U/L (ref 39–117)
BUN: 16 mg/dL (ref 6–23)
CO2: 30 mEq/L (ref 19–32)
Calcium: 9.2 mg/dL (ref 8.4–10.5)
Chloride: 101 mEq/L (ref 96–112)
Creatinine, Ser: 0.74 mg/dL (ref 0.40–1.20)
GFR: 87.57 mL/min (ref >60.00)
Glucose, Bld: 88 mg/dL (ref 70–99)
Potassium: 4.4 mEq/L (ref 3.5–5.1)
Sodium: 137 mEq/L (ref 135–145)
Total Bilirubin: 0.7 mg/dL (ref 0.2–1.2)
Total Protein: 7 g/dL (ref 6.0–8.3)

## 2019-02-05 LAB — CBC WITH DIFFERENTIAL/PLATELET
Basophils Absolute: 0.1 10*3/uL (ref 0.0–0.1)
Basophils Relative: 1.3 % (ref 0.0–3.0)
Eosinophils Absolute: 0 10*3/uL (ref 0.0–0.7)
Eosinophils Relative: 0.7 % (ref 0.0–5.0)
HCT: 42.1 % (ref 36.0–46.0)
Hemoglobin: 14.1 g/dL (ref 12.0–15.0)
Lymphocytes Relative: 32.5 % (ref 12.0–46.0)
Lymphs Abs: 2 10*3/uL (ref 0.7–4.0)
MCHC: 33.5 g/dL (ref 30.0–36.0)
MCV: 90.2 fl (ref 78.0–100.0)
Monocytes Absolute: 0.4 10*3/uL (ref 0.1–1.0)
Monocytes Relative: 7 % (ref 3.0–12.0)
Neutro Abs: 3.6 10*3/uL (ref 1.4–7.7)
Neutrophils Relative %: 58.5 % (ref 43.0–77.0)
Platelets: 311 10*3/uL (ref 150.0–400.0)
RBC: 4.66 Mil/uL (ref 3.87–5.11)
RDW: 12.8 % (ref 11.5–15.5)
WBC: 6.1 10*3/uL (ref 4.0–10.5)

## 2019-02-05 LAB — LIPID PANEL
Cholesterol: 213 mg/dL — ABNORMAL HIGH (ref 0–200)
HDL: 53.8 mg/dL (ref >39.00)
NonHDL: 158.91
Total CHOL/HDL Ratio: 4
Triglycerides: 246 mg/dL — ABNORMAL HIGH (ref 0.0–149.0)
VLDL: 49.2 mg/dL — ABNORMAL HIGH (ref 0.0–40.0)

## 2019-02-05 LAB — VITAMIN D 25 HYDROXY (VIT D DEFICIENCY, FRACTURES): VITD: 29.58 ng/mL — ABNORMAL LOW (ref 30.00–100.00)

## 2019-02-05 LAB — HEMOGLOBIN A1C: Hgb A1c MFr Bld: 5.4 % (ref 4.6–6.5)

## 2019-02-05 LAB — TSH: TSH: 1.25 u[IU]/mL (ref 0.35–4.50)

## 2019-02-05 LAB — LDL CHOLESTEROL, DIRECT: Direct LDL: 138 mg/dL

## 2019-02-07 LAB — CYTOLOGY - PAP
Diagnosis: NEGATIVE
HPV: NOT DETECTED

## 2019-02-08 ENCOUNTER — Other Ambulatory Visit: Payer: Self-pay | Admitting: Family

## 2019-02-08 DIAGNOSIS — B379 Candidiasis, unspecified: Secondary | ICD-10-CM

## 2019-02-08 MED ORDER — FLUCONAZOLE 150 MG PO TABS: 150.0000 mg | ORAL_TABLET | Freq: Once | ORAL | 1 refills | Status: AC

## 2019-02-13 ENCOUNTER — Other Ambulatory Visit: Payer: Self-pay

## 2019-02-13 ENCOUNTER — Encounter: Payer: Self-pay | Admitting: Internal Medicine

## 2019-02-13 ENCOUNTER — Inpatient Hospital Stay: Payer: BC Managed Care – PPO | Attending: Internal Medicine | Admitting: Internal Medicine

## 2019-02-13 DIAGNOSIS — Z79899 Other long term (current) drug therapy: Secondary | ICD-10-CM | POA: Diagnosis not present

## 2019-02-13 DIAGNOSIS — Z803 Family history of malignant neoplasm of breast: Secondary | ICD-10-CM | POA: Insufficient documentation

## 2019-02-13 DIAGNOSIS — Z7982 Long term (current) use of aspirin: Secondary | ICD-10-CM

## 2019-02-13 DIAGNOSIS — D6851 Activated protein C resistance: Secondary | ICD-10-CM | POA: Diagnosis not present

## 2019-02-13 NOTE — Progress Notes (Signed)
[facility] CONSULT NOTE  Patient Care Team: Burnard Hawthorne, FNP as PCP - General (Family Medicine)  CHIEF COMPLAINTS/PURPOSE OF CONSULTATION: DVT/PE  # HETEROZYGOUS FATCOR VE LEIDEN [screening]; NO hx of DVT/PE  #  Oncology History   No history exists.     HISTORY OF PRESENTING ILLNESS:  Julia Nolan 38 y.o.  female with a history of heterozygous factor V Leiden/with no prior personal history of DVT or PE is here for initial consultation.  Patient was diagnosed with heterozygous factor V Leiden deficiency after father had a thromboembolic event/which led to screening amongnst the family.  Subsequently " 5 of uncles" diagnosed with DVT/PE.  And after 3 siblings only patient was diagnosed with heterozygous factor V Leiden.  Patient denies any personal history of blood clots.  She is not on birth control pills.  Patient took Lovenox injections peripartum with her 3 pregnancies.  Uneventful.   Review of Systems  Constitutional: Negative for chills, diaphoresis, fever, malaise/fatigue and weight loss.  HENT: Negative for nosebleeds and sore throat.   Eyes: Negative for double vision.  Respiratory: Negative for cough, hemoptysis, sputum production, shortness of breath and wheezing.   Cardiovascular: Negative for chest pain, palpitations, orthopnea and leg swelling.  Gastrointestinal: Negative for abdominal pain, blood in stool, constipation, diarrhea, heartburn, melena, nausea and vomiting.  Genitourinary: Negative for dysuria, frequency and urgency.  Musculoskeletal: Negative for back pain and joint pain.  Skin: Negative.  Negative for itching and rash.  Neurological: Negative for dizziness, tingling, focal weakness, weakness and headaches.  Endo/Heme/Allergies: Does not bruise/bleed easily.  Psychiatric/Behavioral: Negative for depression. The patient is not nervous/anxious and does not have insomnia.      MEDICAL HISTORY:  History reviewed. No pertinent past  medical history.  SURGICAL HISTORY: History reviewed. No pertinent surgical history.  SOCIAL HISTORY: Social History   Socioeconomic History   Marital status: Married    Spouse name: Aneisa Karren   Number of children: 3   Years of education: Not on file   Highest education level: Bachelor's degree (e.g., BA, AB, BS)  Occupational History   Not on file  Social Needs   Financial resource strain: Not on file   Food insecurity    Worry: Not on file    Inability: Not on file   Transportation needs    Medical: Not on file    Non-medical: Not on file  Tobacco Use   Smoking status: Never Smoker   Smokeless tobacco: Never Used  Substance and Sexual Activity   Alcohol use: Yes   Drug use: Never   Sexual activity: Yes  Lifestyle   Physical activity    Days per week: Not on file    Minutes per session: Not on file   Stress: Not on file  Relationships   Social connections    Talks on phone: Not on file    Gets together: Not on file    Attends religious service: Not on file    Active member of club or organization: Not on file    Attends meetings of clubs or organizations: Not on file    Relationship status: Not on file   Intimate partner violence    Fear of current or ex partner: Not on file    Emotionally abused: Not on file    Physically abused: Not on file    Forced sexual activity: Not on file  Other Topics Concern   Not on file  Social History Narrative   Moved  here from [place].       Stay at home mom      8, 5, 4- boys.     FAMILY HISTORY: Family History  Problem Relation Age of Onset   Depression Mother    Hyperlipidemia Mother    Hyperlipidemia Father    Depression Father    Prostate cancer Father    Depression Sister    Stroke Maternal Grandmother    Arthritis Maternal Grandfather    Diabetes Maternal Grandfather    Hyperlipidemia Paternal Grandmother    Hyperlipidemia Paternal Grandfather    Hypertension Paternal  Grandfather    Kidney disease Paternal Grandfather    Colon cancer Neg Hx    Breast cancer Neg Hx     ALLERGIES:  has No Known Allergies.  MEDICATIONS:  Current Outpatient Medications  Medication Sig Dispense Refill   aspirin EC 81 MG tablet Take 81 mg by mouth daily.     Probiotic Product (PROBIOTIC-10 PO) Take by mouth.     sertraline (ZOLOFT) 100 MG tablet Take 1 tablet (100 mg total) by mouth daily. 90 tablet 1   raNITIdine HCl (WAL-ZAN 75 PO) Zantac 75  once a day     No current facility-administered medications for this visit.       Marland Kitchen  PHYSICAL EXAMINATION:  Vitals:   02/13/19 1519  BP: 139/87  Pulse: 61  Resp: 16  Temp: 97.6 F (36.4 C)   Filed Weights   02/13/19 1519  Weight: 170 lb (77.1 kg)    Physical Exam  Constitutional: She is oriented to person, place, and time and well-developed, well-nourished, and in no distress.  HENT:  Head: Normocephalic and atraumatic.  Mouth/Throat: Oropharynx is clear and moist. No oropharyngeal exudate.  Eyes: Pupils are equal, round, and reactive to light.  Neck: Normal range of motion. Neck supple.  Cardiovascular: Normal rate and regular rhythm.  Pulmonary/Chest: Effort normal and breath sounds normal. No respiratory distress. She has no wheezes.  Abdominal: Soft. Bowel sounds are normal. She exhibits no distension and no mass. There is no abdominal tenderness. There is no rebound and no guarding.  Musculoskeletal: Normal range of motion.        General: No tenderness or edema.  Neurological: She is alert and oriented to person, place, and time.  Skin: Skin is warm.  Psychiatric: Affect normal.     LABORATORY DATA:  I have reviewed the data as listed Lab Results  Component Value Date   WBC 6.1 02/04/2019   HGB 14.1 02/04/2019   HCT 42.1 02/04/2019   MCV 90.2 02/04/2019   PLT 311.0 02/04/2019   Recent Labs    02/04/19 1425  NA 137  K 4.4  CL 101  CO2 30  GLUCOSE 88  BUN 16  CREATININE 0.74   CALCIUM 9.2  PROT 7.0  ALBUMIN 4.5  AST 24  ALT 25  ALKPHOS 79  BILITOT 0.7    RADIOGRAPHIC STUDIES: I have personally reviewed the radiological images as listed and agreed with the findings in the report. No results found.  ASSESSMENT & PLAN:   Factor V Leiden mutation ([place]) #Factor V Leiden heterozygous [reportedly patient-diagnosis screening.  No personal history of DVT/PE.  #I had a long discussion with the patient regarding the variability of development of clinically significant DVT/PE with factor V heterozygous patients.  At this time patient does not have any other risk factors for potential DVT/PE-not on birth control pills/no smoking/no obesity.  #I would not recommend any prophylactic  anticoagulation for the patient.  Also no role for prophylactic aspirin at this time for prevention of thromboembolic events.  However I did asked the patient to be vigilant about the potential development of thromboembolic events if she had to take a long car ride/flight; surgeries etc.  Patient took Lovenox during peripartum with her 3 pregnancies.   #Screening for children/boys x3-discussed regarding talking to the children's PCP when older than 18.  Otherwise would not recommend any work-up at this time.  Thank you, Ms. Arnett FNP for allowing me to participate in the care of your pleasant patient. Please do not hesitate to contact me with questions or concerns in the interim.  # 45 minutes face-to-face with the patient discussing the above plan of care; more than 50% of time spent on prognosis/ natural history; counseling and coordination.    Disposition: Follow-up as needed.  All questions were answered. The patient knows to call the clinic with any problems, questions or concerns.    Cammie Sickle, MD 02/14/2019 12:35 PM

## 2019-02-13 NOTE — Progress Notes (Signed)
Has not seen a hematologist in 9 years for Factor V Leiden mutation.  She was previously seen by hematologist in [facility].

## 2019-02-14 NOTE — Assessment & Plan Note (Addendum)
#  Factor V Leiden heterozygous [reportedly patient-diagnosis screening.  No personal history of DVT/PE.  #I had a long discussion with the patient regarding the variability of development of clinically significant DVT/PE with factor V heterozygous patients.  At this time patient does not have any other risk factors for potential DVT/PE-not on birth control pills/no smoking/no obesity.  #I would not recommend any prophylactic anticoagulation for the patient.  Also no role for prophylactic aspirin at this time for prevention of thromboembolic events.  However I did asked the patient to be vigilant about the potential development of thromboembolic events if she had to take a long car ride/flight; surgeries etc.  Patient took Lovenox during peripartum with her 3 pregnancies.   #Screening for children/boys x3-discussed regarding talking to the children's PCP when older than 18.  Otherwise would not recommend any work-up at this time.  Thank you, Ms. Arnett FNP for allowing me to participate in the care of your pleasant patient. Please do not hesitate to contact me with questions or concerns in the interim.  # 45 minutes face-to-face with the patient discussing the above plan of care; more than 50% of time spent on prognosis/ natural history; counseling and coordination.    Disposition: Follow-up as needed.

## 2019-02-20 ENCOUNTER — Encounter: Payer: Self-pay | Admitting: Family

## 2019-03-18 DIAGNOSIS — L578 Other skin changes due to chronic exposure to nonionizing radiation: Secondary | ICD-10-CM | POA: Diagnosis not present

## 2019-03-18 DIAGNOSIS — Z1283 Encounter for screening for malignant neoplasm of skin: Secondary | ICD-10-CM | POA: Diagnosis not present

## 2019-03-18 DIAGNOSIS — D2230 Melanocytic nevi of unspecified part of face: Secondary | ICD-10-CM | POA: Diagnosis not present

## 2019-03-18 DIAGNOSIS — D225 Melanocytic nevi of trunk: Secondary | ICD-10-CM | POA: Diagnosis not present

## 2019-03-28 ENCOUNTER — Ambulatory Visit
Admission: RE | Admit: 2019-03-28 | Discharge: 2019-03-28 | Disposition: A | Discharge status: Home / Self Care | Payer: BC Managed Care – PPO | Source: Ambulatory Visit | Attending: Family | Admitting: Family

## 2019-03-28 DIAGNOSIS — Z1231 Encounter for screening mammogram for malignant neoplasm of breast: Secondary | ICD-10-CM | POA: Diagnosis not present

## 2019-03-28 DIAGNOSIS — Z0000 Encounter for general adult medical examination without abnormal findings: Secondary | ICD-10-CM

## 2019-04-04 DIAGNOSIS — Z20828 Contact with and (suspected) exposure to other viral communicable diseases: Secondary | ICD-10-CM | POA: Diagnosis not present

## 2019-04-04 DIAGNOSIS — J069 Acute upper respiratory infection, unspecified: Secondary | ICD-10-CM | POA: Diagnosis not present

## 2019-07-15 ENCOUNTER — Ambulatory Visit (INDEPENDENT_AMBULATORY_CARE_PROVIDER_SITE_OTHER): Payer: BC Managed Care – PPO | Admitting: Family

## 2019-07-15 ENCOUNTER — Encounter: Payer: Self-pay | Admitting: Family

## 2019-07-15 VITALS — Ht 69.0 in | Wt 165.0 lb

## 2019-07-15 DIAGNOSIS — D6851 Activated protein C resistance: Secondary | ICD-10-CM

## 2019-07-15 DIAGNOSIS — N926 Irregular menstruation, unspecified: Secondary | ICD-10-CM | POA: Diagnosis not present

## 2019-07-15 DIAGNOSIS — N809 Endometriosis, unspecified: Secondary | ICD-10-CM | POA: Insufficient documentation

## 2019-07-15 MED ORDER — ONDANSETRON 4 MG PO TBDP: 4.0000 mg | ORAL_TABLET | Freq: Three times a day (TID) | ORAL | 1 refills | Status: DC | PRN

## 2019-07-15 NOTE — Progress Notes (Signed)
Virtual Visit via Video Note  I connected with@  on 07/15/19 at  4:00 PM EST by a video enabled telemedicine application and verified that I am speaking with the correct person using two identifiers.  Location patient: home Location provider:home office Persons participating in the virtual visit: patient, provider  I discussed the limitations of evaluation and management by telemedicine and the availability of in person appointments. The patient expressed understanding and agreed to proceed.   HPI: Chief complaint cramping with menses. Complains of nausea 'only before' menses and feeling irritable 3 days prior to menses. Consistent prior menses.  Some irregularity of menses,bloating, cramping  Menses around 28 days. Periods are not unusually heavy. No large clots. Some relief with NSAIDs.   Zoloft is helpful.     Like to discuss ablation.   Factor V lieden-a consult with hematologist, Dr Burlene Arnt 01/2019; anticoagulation was not advised for this patient  ROS: See pertinent positives and negatives per HPI.  History reviewed. No pertinent past medical history.  History reviewed. No pertinent surgical history.  Family History  Problem Relation Age of Onset  . Depression Mother   . Hyperlipidemia Mother   . Hyperlipidemia Father   . Depression Father   . Prostate cancer Father   . Depression Sister   . Stroke Maternal Grandmother   . Arthritis Maternal Grandfather   . Diabetes Maternal Grandfather   . Hyperlipidemia Paternal Grandmother   . Hyperlipidemia Paternal Grandfather   . Hypertension Paternal Grandfather   . Kidney disease Paternal Grandfather   . Colon cancer Neg Hx   . Breast cancer Neg Hx     SOCIAL HX: never smoker   Current Outpatient Medications:  .  aspirin EC 81 MG tablet, Take 81 mg by mouth daily., Disp: , Rfl:  .  cholecalciferol (VITAMIN D3) 25 MCG (1000 UT) tablet, Take 2,000 Units by mouth daily., Disp: , Rfl:  .  Probiotic Product (PROBIOTIC-10  PO), Take by mouth., Disp: , Rfl:  .  ondansetron (ZOFRAN ODT) 4 MG disintegrating tablet, Take 1 tablet (4 mg total) by mouth every 8 (eight) hours as needed for nausea or vomiting., Disp: 30 tablet, Rfl: 1 .  sertraline (ZOLOFT) 100 MG tablet, Take 1 tablet (100 mg total) by mouth daily., Disp: 90 tablet, Rfl: 1  EXAM:  VITALS per patient if applicable:  GENERAL: alert, oriented, appears well and in no acute distress  HEENT: atraumatic, conjunttiva clear, no obvious abnormalities on inspection of external nose and ears  NECK: normal movements of the head and neck  LUNGS: on inspection no signs of respiratory distress, breathing rate appears normal, no obvious gross SOB, gasping or wheezing  CV: no obvious cyanosis  MS: moves all visible extremities without noticeable abnormality  PSYCH/NEURO: pleasant and cooperative, no obvious depression or anxiety, speech and thought processing grossly intact  ASSESSMENT AND PLAN:  Discussed the following assessment and plan:  Irregular menses - Plan: ondansetron (ZOFRAN ODT) 4 MG disintegrating tablet, Ambulatory referral to Obstetrics / Gynecology  Factor V Leiden mutation [facility]) Problem List Items Addressed This Visit      Hematopoietic and Hemostatic   Factor V Leiden mutation ([place])     Other   Irregular menses - Primary    Quality of life is quite bothered by the nausea and bloating which comes premenstrual.  Some irregularity of menses  History of factor V Leiden mutation unfortunately she is not a candidate for OCP.  I have referred her to GYN for further  discussion of her options.  In the meantime I provided her with Zofran for symptom relief      Relevant Medications   ondansetron (ZOFRAN ODT) 4 MG disintegrating tablet   Other Relevant Orders   Ambulatory referral to Obstetrics / Gynecology      -we discussed possible serious and likely etiologies, options for evaluation and workup, limitations of telemedicine visit vs in  person visit, treatment, treatment risks and precautions. Pt prefers to treat via telemedicine empirically rather then risking or undertaking an in person visit at this moment. Patient agrees to seek prompt in person care if worsening, new symptoms arise, or if is not improving with treatment.   I discussed the assessment and treatment plan with the patient. The patient was provided an opportunity to ask questions and all were answered. The patient agreed with the plan and demonstrated an understanding of the instructions.   The patient was advised to call back or seek an in-person evaluation if the symptoms worsen or if the condition fails to improve as anticipated.   Mable Paris, FNP

## 2019-07-15 NOTE — Assessment & Plan Note (Signed)
Quality of life is quite bothered by the nausea and bloating which comes premenstrual.  Some irregularity of menses  History of factor V Leiden mutation unfortunately she is not a candidate for OCP.  I have referred her to GYN for further discussion of her options.  In the meantime I provided her with Zofran for symptom relief

## 2019-07-30 ENCOUNTER — Other Ambulatory Visit: Payer: Self-pay

## 2019-07-30 ENCOUNTER — Encounter: Payer: Self-pay | Admitting: Obstetrics and Gynecology

## 2019-07-30 ENCOUNTER — Ambulatory Visit (INDEPENDENT_AMBULATORY_CARE_PROVIDER_SITE_OTHER): Payer: BC Managed Care – PPO | Admitting: Obstetrics and Gynecology

## 2019-07-30 ENCOUNTER — Encounter: Payer: Self-pay | Admitting: Internal Medicine

## 2019-07-30 VITALS — BP 132/77 | HR 61 | Ht 69.0 in | Wt 171.5 lb

## 2019-07-30 DIAGNOSIS — D6851 Activated protein C resistance: Secondary | ICD-10-CM | POA: Diagnosis not present

## 2019-07-30 DIAGNOSIS — N946 Dysmenorrhea, unspecified: Secondary | ICD-10-CM | POA: Diagnosis not present

## 2019-07-30 DIAGNOSIS — N943 Premenstrual tension syndrome: Secondary | ICD-10-CM | POA: Diagnosis not present

## 2019-07-30 NOTE — Progress Notes (Signed)
HPI:      [name] is a 39 y.o. No obstetric history on file. who LMP was Patient's last menstrual period was 07/15/2019.  Subjective:   She presents today with complaint of mittelschmerz involving nausea vomiting and discomfort as well as PMS symptoms of mood changes bloating and cramping. Patient has completed childbearing and her husband has a vasectomy for birth control Of significant note she has factor V Leiden mutation. She is considering the possibility of hysterectomy for alleviation of the above-noted symptoms.    Hx: The following portions of the patient's history were reviewed and updated as appropriate:             She  has no past medical history on file. She does not have any pertinent problems on file. She  has no past surgical history on file. Her family history includes Arthritis in her maternal grandfather; Depression in her father, mother, and sister; Diabetes in her maternal grandfather; Hyperlipidemia in her father, mother, paternal grandfather, and paternal grandmother; Hypertension in her paternal grandfather; Kidney disease in her paternal grandfather; Prostate cancer in her father; Stroke in her maternal grandmother. She  reports that she has never smoked. She has never used smokeless tobacco. She reports current alcohol use. She reports that she does not use drugs. She has a current medication list which includes the following prescription(s): aspirin ec, cholecalciferol, ondansetron, probiotic product, and sertraline. She has No Known Allergies.       Review of Systems:  Review of Systems  Constitutional: Denied constitutional symptoms, night sweats, recent illness, fatigue, fever, insomnia and weight loss.  Eyes: Denied eye symptoms, eye pain, photophobia, vision change and visual disturbance.  Ears/Nose/Throat/Neck: Denied ear, nose, throat or neck symptoms, hearing loss, nasal discharge, sinus congestion and sore throat.  Cardiovascular: Denied  cardiovascular symptoms, arrhythmia, chest pain/pressure, edema, exercise intolerance, orthopnea and palpitations.  Respiratory: Denied pulmonary symptoms, asthma, pleuritic pain, productive sputum, cough, dyspnea and wheezing.  Gastrointestinal: Denied, gastro-esophageal reflux, melena, nausea and vomiting.  Genitourinary: Denied genitourinary symptoms including symptomatic vaginal discharge, pelvic relaxation issues, and urinary complaints.  Musculoskeletal: Denied musculoskeletal symptoms, stiffness, swelling, muscle weakness and myalgia.  Dermatologic: Denied dermatology symptoms, rash and scar.  Neurologic: Denied neurology symptoms, dizziness, headache, neck pain and syncope.  Psychiatric: Denied psychiatric symptoms, anxiety and depression.  Endocrine: Denied endocrine symptoms including hot flashes and night sweats.   Meds:   Current Outpatient Medications on File Prior to Visit  Medication Sig Dispense Refill  . aspirin EC 81 MG tablet Take 81 mg by mouth daily.    . cholecalciferol (VITAMIN D3) 25 MCG (1000 UT) tablet Take 2,000 Units by mouth daily.    . ondansetron (ZOFRAN ODT) 4 MG disintegrating tablet Take 1 tablet (4 mg total) by mouth every 8 (eight) hours as needed for nausea or vomiting. 30 tablet 1  . Probiotic Product (PROBIOTIC-10 PO) Take by mouth.    . sertraline (ZOLOFT) 100 MG tablet Take 1 tablet (100 mg total) by mouth daily. 90 tablet 1   No current facility-administered medications on file prior to visit.    Objective:     Vitals:   07/30/19 1029  BP: 132/77  Pulse: 61                Assessment:    No obstetric history on file. Patient Active Problem List   Diagnosis Date Noted  . Irregular menses 07/15/2019  . Routine physical examination 02/04/2019  . Factor V Leiden mutation (  [place]) 08/08/2018  . Depression, major, single episode, mild ([place]) 08/08/2018     1. PMS (premenstrual syndrome)   2. Factor 5 Leiden mutation, heterozygous ([place])    3. Dysmenorrhea     Patient would like to discuss management of the above symptoms in light of the fact that OCPs are not an option.   Plan:            1.  We have discussed safe hormonal methods of birth control that do not affect factor V mutation/blood clotting.  My recommendation is that she try IUD for 3 months because it will likely take away her menstrual period and may prevent ovulation thus alleviating her midcycle symptoms.  Should she fail IUD I would consider hysterectomy for alleviation of symptoms.  I have also spoken with her regarding the possibility of low-dose HRT with menopause and she will consult with her hematologist regarding the factor V mutation and the ability to use low-dose estrogen. Orders No orders of the defined types were placed in this encounter.   No orders of the defined types were placed in this encounter.     F/U  Return for She is to call at the start of next menses. I spent 32 minutes involved in the care of this patient of which greater than 50% was spent discussing please see discussion above and plan.  All questions answered.  Finis Bud, M.D. 07/30/2019 11:16 AM

## 2019-08-01 ENCOUNTER — Telehealth: Payer: Self-pay | Admitting: Internal Medicine

## 2019-08-01 NOTE — Telephone Encounter (Signed)
On 1/06-again left a message for the patient regarding with recommendations for progesterone based IUDs/ also sent my chart message.

## 2019-08-16 ENCOUNTER — Encounter: Payer: Self-pay | Admitting: Obstetrics and Gynecology

## 2019-08-16 ENCOUNTER — Other Ambulatory Visit: Payer: Self-pay

## 2019-08-16 ENCOUNTER — Ambulatory Visit (INDEPENDENT_AMBULATORY_CARE_PROVIDER_SITE_OTHER): Payer: BC Managed Care – PPO | Admitting: Obstetrics and Gynecology

## 2019-08-16 VITALS — BP 118/74 | Ht 69.0 in | Wt 171.0 lb

## 2019-08-16 DIAGNOSIS — G8929 Other chronic pain: Secondary | ICD-10-CM | POA: Diagnosis not present

## 2019-08-16 DIAGNOSIS — R102 Pelvic and perineal pain: Secondary | ICD-10-CM

## 2019-08-16 NOTE — Progress Notes (Signed)
Obstetrics & Gynecology Office Visit   Chief Complaint  Patient presents with  . Endometriosis   History of Present Illness: 39 y.o. G30P3003 female who presents for possible endometriosis.  She has a strong family history of the same.  Her mom, her sister, and her maternal grandmother are all affected and have had hysterectomies. Her other sister has symptoms, but is undiagnosed.    Over the past year she has had increasing symptoms related to her cycles, n/v/cramping, bloating, horrible mood swings. She has also had increased acne on her neck/chin.  She also has headaches and constipation.    She has a history of Factor V Leiden heterozygosity. Her father and three of his four brother were in the hospital at one point with DVT/PE.  They were tested and had Factor V Leiden.  So, she was tested.  And her 3 children all have Factor V Leiden.  She has never had VTE.  She took lovenox with her pregnancies.   The first symptom she noticed was nausea with ovulation and with her cycle.  Her sister had a hysterectomy when she was 22. Her sister was diagnosed when she was 64-65 years old.    Menses regular, monthly, last 3-5 days. Menses are heavy with clots. She has a lot of pain during ovulation and a couple of days prior to the onset of her menses.  She rates the pain an 8/10. She has pain and bloating and this goes away after the first day her period.    Past Medical History:  Diagnosis Date  . Anxiety   . Depression   . Factor 5 Leiden mutation, heterozygous [facility])     Past Surgical History:  Procedure Laterality Date  . NO PAST SURGERIES      Gynecologic History: Patient's last menstrual period was 08/08/2019.  Obstetric History: [id number]  Family History  Problem Relation Age of Onset  . Depression Mother   . Hyperlipidemia Mother   . Endometriosis Mother   . Hyperlipidemia Father   . Depression Father   . Prostate cancer Father   . Depression Sister   . Stroke Maternal  Grandmother   . Endometriosis Maternal Grandmother   . Arthritis Maternal Grandfather   . Diabetes Maternal Grandfather   . Hyperlipidemia Paternal Grandmother   . Hyperlipidemia Paternal Grandfather   . Hypertension Paternal Grandfather   . Kidney disease Paternal Grandfather   . Colon cancer Neg Hx   . Breast cancer Neg Hx     Social History   Socioeconomic History  . Marital status: Married    Spouse name: Christyna Fenderson  . Number of children: 3  . Years of education: Not on file  . Highest education level: Bachelor's degree (e.g., BA, AB, BS)  Occupational History  . Not on file  Tobacco Use  . Smoking status: Never Smoker  . Smokeless tobacco: Never Used  Substance and Sexual Activity  . Alcohol use: Yes  . Drug use: Never  . Sexual activity: Yes    Birth control/protection: None  Other Topics Concern  . Not on file  Social History Narrative   Moved here from [place].       Stay at home mom      8, 5, 4- boys.    Social Determinants of Health   Financial Resource Strain:   . Difficulty of Paying Living Expenses: Not on file  Food Insecurity:   . Worried About Charity fundraiser in the Last Year: Not on  file  . [place] in the Last Year: Not on file  Transportation Needs:   . Lack of Transportation (Medical): Not on file  . Lack of Transportation (Non-Medical): Not on file  Physical Activity:   . Days of Exercise per Week: Not on file  . Minutes of Exercise per Session: Not on file  Stress:   . Feeling of Stress : Not on file  Social Connections:   . Frequency of Communication with Friends and Family: Not on file  . Frequency of Social Gatherings with Friends and Family: Not on file  . Attends Religious Services: Not on file  . Active Member of Clubs or Organizations: Not on file  . Attends Archivist Meetings: Not on file  . Marital Status: Not on file  Intimate Partner Violence:   . Fear of Current or Ex-Partner: Not on file  .  Emotionally Abused: Not on file  . Physically Abused: Not on file  . Sexually Abused: Not on file   Allergies: No Known Allergies  Prior to Admission medications   Medication Sig Start Date End Date Taking? Authorizing Provider  aspirin EC 81 MG tablet Take 81 mg by mouth daily.   Yes [provider]  cholecalciferol (VITAMIN D3) 25 MCG (1000 UT) tablet Take 2,000 Units by mouth daily.   Yes [provider]  ondansetron (ZOFRAN ODT) 4 MG disintegrating tablet Take 1 tablet (4 mg total) by mouth every 8 (eight) hours as needed for nausea or vomiting. 07/15/19  Yes Arnett, Yvetta Coder, FNP  Probiotic Product (PROBIOTIC-10 PO) Take by mouth.   Yes [provider]  sertraline (ZOLOFT) 100 MG tablet Take 1 tablet (100 mg total) by mouth daily. 02/04/19  Yes Burnard Hawthorne, FNP    Review of Systems  Constitutional: Negative.   HENT: Negative.   Eyes: Negative.   Respiratory: Negative.   Cardiovascular: Negative.   Gastrointestinal: Negative.   Genitourinary: Negative.        See HPI  Musculoskeletal: Negative.   Skin: Negative.   Neurological: Negative.   Psychiatric/Behavioral: Negative.      Physical Exam BP 118/74   Ht 5\' 9"  (1.753 m)   Wt 171 lb (77.6 kg)   LMP 08/08/2019   BMI 25.25 kg/m  Patient's last menstrual period was 08/08/2019. Physical Exam Constitutional:      General: She is not in acute distress.    Appearance: Normal appearance.  HENT:     Head: Normocephalic and atraumatic.  Eyes:     General: No scleral icterus.    Conjunctiva/sclera: Conjunctivae normal.  Pulmonary:     Effort: No respiratory distress.  Neurological:     General: No focal deficit present.     Mental Status: She is alert and oriented to person, place, and time.     Cranial Nerves: No cranial nerve deficit.  Psychiatric:        Mood and Affect: Mood normal.        Behavior: Behavior normal.        Judgment: Judgment normal.    Assessment: 39 y.o.  G3P3003 female here for  1. Chronic pelvic pain in female      Plan: Problem List Items Addressed This Visit    None    Visit Diagnoses    Chronic pelvic pain in female    -  Primary   Relevant Orders   US PELVIS TRANSVAGINAL NON-OB (TV ONLY)     We discussed her  symptom constellation in the context of her strong family history of endometriosis.  We discussed the difficulty in diagnosing endometriosis.  We discussed that the gold standard for diagnosis is laparoscopy.  We also discussed that in some cases presumptive treatment is performed instead of diagnostic laparoscopy.  We will obtain a pelvic ultrasound to assess for other causes to her symptom constellation.  However, in the absence of structural issues, her differential diagnosis includes; dysmenorrhea, mittelschmerz, endometriosis, PMDD.  We discussed potential treatment strategies for endometriosis which include; clinical observation (no treatment at this time), medication treatment with an IUD containing progesterone, treatment such as Freida Busman or Depo Lupron.  Further, we discussed surgical options, including; diagnostic laparoscopy and definitive treatment with hysterectomy.  Her treatment options are limited due to her history of factor V Leiden heterozygosity.  After much discussion a mutual decision was made to obtain a pelvic ultrasound to rule out other structural etiologies and consider trying medication management as an initial approach.  After discussion of the potential medication options, she seems most interested in a levonorgestrel containing IUD.  We will have her follow-up for ultrasound soon and potential placement of IUD, presuming a normal ultrasound.  All questions answered.  She was encouraged to send questions through my chart if other questions occurred to her in the meantime.  30 minutes spent in face to face discussion with > 50% spent in counseling,management, and coordination of care of her chronic pelvic pain in  female.   Prentice Docker, MD 08/16/2019 8:50 PM

## 2019-08-21 ENCOUNTER — Other Ambulatory Visit: Payer: Self-pay | Admitting: Family

## 2019-08-21 DIAGNOSIS — F320 Major depressive disorder, single episode, mild: Secondary | ICD-10-CM

## 2019-08-29 ENCOUNTER — Encounter: Payer: Self-pay | Admitting: Obstetrics and Gynecology

## 2019-08-29 ENCOUNTER — Telehealth: Payer: Self-pay | Admitting: Obstetrics and Gynecology

## 2019-08-29 ENCOUNTER — Other Ambulatory Visit: Payer: Self-pay

## 2019-08-29 ENCOUNTER — Ambulatory Visit (INDEPENDENT_AMBULATORY_CARE_PROVIDER_SITE_OTHER): Payer: BC Managed Care – PPO

## 2019-08-29 ENCOUNTER — Ambulatory Visit (INDEPENDENT_AMBULATORY_CARE_PROVIDER_SITE_OTHER): Payer: BC Managed Care – PPO | Admitting: Obstetrics and Gynecology

## 2019-08-29 VITALS — BP 122/70 | Ht 69.0 in | Wt 170.0 lb

## 2019-08-29 DIAGNOSIS — N946 Dysmenorrhea, unspecified: Secondary | ICD-10-CM

## 2019-08-29 DIAGNOSIS — R102 Pelvic and perineal pain: Secondary | ICD-10-CM | POA: Diagnosis not present

## 2019-08-29 DIAGNOSIS — R9389 Abnormal findings on diagnostic imaging of other specified body structures: Secondary | ICD-10-CM

## 2019-08-29 DIAGNOSIS — G8929 Other chronic pain: Secondary | ICD-10-CM

## 2019-08-29 NOTE — Telephone Encounter (Signed)
[place] ON 2/25 AT [place]

## 2019-08-29 NOTE — Progress Notes (Signed)
Gynecology Ultrasound Follow Up   Chief Complaint  Patient presents with  . Follow-up  Ultrasound for pelvic pain   History of Present Illness: Patient is a 39 y.o. female who presents today for ultrasound evaluation of the above .  Ultrasound demonstrates the following findings Adnexa: no masses seen (corpus luteal cyst noted) Uterus: retroverted with endometrial stripe  14.8 mm Additional: anterior myometrium appears thicker than posterior. Small amount of free fluid, likely physiologic.   Past Medical History:  Diagnosis Date  . Anxiety   . Depression   . Factor 5 Leiden mutation, heterozygous [facility])     Past Surgical History:  Procedure Laterality Date  . NO PAST SURGERIES      Family History  Problem Relation Age of Onset  . Depression Mother   . Hyperlipidemia Mother   . Endometriosis Mother   . Hyperlipidemia Father   . Depression Father   . Prostate cancer Father   . Depression Sister   . Stroke Maternal Grandmother   . Endometriosis Maternal Grandmother   . Arthritis Maternal Grandfather   . Diabetes Maternal Grandfather   . Hyperlipidemia Paternal Grandmother   . Hyperlipidemia Paternal Grandfather   . Hypertension Paternal Grandfather   . Kidney disease Paternal Grandfather   . Colon cancer Neg Hx   . Breast cancer Neg Hx     Social History   Socioeconomic History  . Marital status: Married    Spouse name: Shanann Parraga  . Number of children: 3  . Years of education: Not on file  . Highest education level: Bachelor's degree (e.g., BA, AB, BS)  Occupational History  . Not on file  Tobacco Use  . Smoking status: Never Smoker  . Smokeless tobacco: Never Used  Substance and Sexual Activity  . Alcohol use: Yes  . Drug use: Never  . Sexual activity: Yes    Birth control/protection: None  Other Topics Concern  . Not on file  Social History Narrative   Moved here from [place].       Stay at home mom      8, 5, 4- boys.    Social  Determinants of Health   Financial Resource Strain:   . Difficulty of Paying Living Expenses: Not on file  Food Insecurity:   . Worried About Charity fundraiser in the Last Year: Not on file  . Ran Out of Food in the Last Year: Not on file  Transportation Needs:   . Lack of Transportation (Medical): Not on file  . Lack of Transportation (Non-Medical): Not on file  Physical Activity:   . Days of Exercise per Week: Not on file  . Minutes of Exercise per Session: Not on file  Stress:   . Feeling of Stress : Not on file  Social Connections:   . Frequency of Communication with Friends and Family: Not on file  . Frequency of Social Gatherings with Friends and Family: Not on file  . Attends Religious Services: Not on file  . Active Member of Clubs or Organizations: Not on file  . Attends Archivist Meetings: Not on file  . Marital Status: Not on file  Intimate Partner Violence:   . Fear of Current or Ex-Partner: Not on file  . Emotionally Abused: Not on file  . Physically Abused: Not on file  . Sexually Abused: Not on file    No Known Allergies  Prior to Admission medications   Medication Sig Start Date End Date Taking? Authorizing  Provider  aspirin EC 81 MG tablet Take 81 mg by mouth daily.    [provider]  cholecalciferol (VITAMIN D3) 25 MCG (1000 UT) tablet Take 2,000 Units by mouth daily.    [provider]  ondansetron (ZOFRAN ODT) 4 MG disintegrating tablet Take 1 tablet (4 mg total) by mouth every 8 (eight) hours as needed for nausea or vomiting. 07/15/19   Burnard Hawthorne, FNP  Probiotic Product (PROBIOTIC-10 PO) Take by mouth.    [provider]  sertraline (ZOLOFT) 100 MG tablet TAKE 1 TABLET BY MOUTH EVERY DAY 08/21/19   Burnard Hawthorne, FNP    Physical Exam BP 122/70   Ht 5\' 9"  (1.753 m)   Wt 170 lb (77.1 kg)   LMP 08/08/2019   BMI 25.10 kg/m    General: NAD HEENT: normocephalic, anicteric Pulmonary: No increased  work of breathing Extremities: no edema, erythema, or tenderness Neurologic: Grossly intact, normal gait Psychiatric: mood appropriate, affect full  Imaging Results US PELVIS TRANSVAGINAL NON-OB (TV ONLY)  Result Date: 08/29/2019 Patient Name: Julia Nolan DOB: 09-Jun-1981 MRN: [id number] ULTRASOUND REPORT Location: [facility] Date of Service: 08/29/2019 Indications:Pelvic Pain Findings: The uterus is retroverted and measures 8.8 x 6.4 x 5.2 cm. Echo texture is heterogenous without evidence of focal masses. The anterior myometrium is thicker than the posterior myometrium.  There is a suggestion of heterogeneity of her endometrium.  The Endometrium measures 14.8 mm. Right Ovary measures 3.5 x 2.0 x 1.8 cm. It is normal in appearance. Left Ovary measures 3.8 x 3.1 x 2.6 cm. It is normal in appearance. There is a corpus luteal cyst in the left ovary. Survey of the adnexa demonstrates no adnexal masses. There is a small amount of anechoic free fluid in the cul de sac, 34 x 12 x 23 mm. Impression: 1. Asymmetric thickening of myometrium (anterior greater than posterior).  Possible etiologies, include; adenomyosis, imaging angle appearance, adenomyoma vs other. 2. Otherwise, normal pelvic ultrasound. Gweneth Dimitri, RT The ultrasound images and findings were reviewed by me and I agree with the above report. Prentice Docker, MD, [facility], [facility] 08/29/2019 11:49 AM       Assessment: 39 y.o. G3P3003  1. Chronic pelvic pain in female   2. Dysmenorrhea      Plan: Problem List Items Addressed This Visit    None    Visit Diagnoses    Chronic pelvic pain in female    -  Primary   Dysmenorrhea         Discussed ultrasound findings.  Patient had her last menses on January 21.  This likely explains the thickness of the lining of her uterus.  No evidence of polyp on ultrasound.  Discussed treatment options based on possible adenomyosis or adenomyoma.  Discussed some doubt in  the diagnosis based on limitations of ultrasound capability.  Reviewed treatment options and patient elects to go with placement of Mirena IUD for treatment.  Discussed expectations for symptom relief with IUD.  Will place as patient is finishing her next menses.  15 minutes spent in face to face discussion with > 50% spent in counseling,management, and coordination of care of her chronic pelvic pain female and dysmenorrhea.   Prentice Docker, MD, [facility], [facility] 08/29/2019 12:04 PM

## 2019-09-02 NOTE — Telephone Encounter (Signed)
Noted. Will order to arrive by apt date/time. 

## 2019-09-06 DIAGNOSIS — Z20828 Contact with and (suspected) exposure to other viral communicable diseases: Secondary | ICD-10-CM | POA: Diagnosis not present

## 2019-09-13 NOTE — Telephone Encounter (Signed)
Mirena reserved for this patient in [place]

## 2019-09-16 NOTE — Telephone Encounter (Signed)
Patient is schedule Thursday, 10/03/19 with SDJ at 2 pm

## 2019-09-16 NOTE — Telephone Encounter (Signed)
[place] location

## 2019-09-19 ENCOUNTER — Ambulatory Visit: Payer: BC Managed Care – PPO | Admitting: Obstetrics and Gynecology

## 2019-09-19 NOTE — Telephone Encounter (Signed)
Noted  

## 2019-09-25 DIAGNOSIS — Z23 Encounter for immunization: Secondary | ICD-10-CM | POA: Diagnosis not present

## 2019-10-03 ENCOUNTER — Ambulatory Visit: Payer: BC Managed Care – PPO | Admitting: Obstetrics and Gynecology

## 2019-10-04 NOTE — Telephone Encounter (Signed)
Cancelled via automated system per Epic. Mirena placed back in stock. Will need to be reserved again if patient reschedules.

## 2019-10-22 DIAGNOSIS — Z23 Encounter for immunization: Secondary | ICD-10-CM | POA: Diagnosis not present

## 2019-11-26 NOTE — Telephone Encounter (Signed)
Patient is schedule for 11/28/19 with SDJ for mirena placement

## 2019-11-26 NOTE — Telephone Encounter (Signed)
Patient is schedule for 11/28/19 with SDJ

## 2019-11-28 ENCOUNTER — Ambulatory Visit: Payer: BC Managed Care – PPO | Admitting: Obstetrics and Gynecology

## 2019-11-28 NOTE — Telephone Encounter (Signed)
Pt was told [place] but was scheduled in [place]. Pt is coming 5/7 at 130 with SDJ

## 2019-11-29 ENCOUNTER — Other Ambulatory Visit: Payer: Self-pay

## 2019-11-29 ENCOUNTER — Encounter: Payer: Self-pay | Admitting: Obstetrics and Gynecology

## 2019-11-29 ENCOUNTER — Ambulatory Visit (INDEPENDENT_AMBULATORY_CARE_PROVIDER_SITE_OTHER): Payer: BC Managed Care – PPO | Admitting: Obstetrics and Gynecology

## 2019-11-29 VITALS — BP 122/70 | Ht 69.0 in | Wt 174.0 lb

## 2019-11-29 DIAGNOSIS — R102 Pelvic and perineal pain: Secondary | ICD-10-CM

## 2019-11-29 DIAGNOSIS — N946 Dysmenorrhea, unspecified: Secondary | ICD-10-CM

## 2019-11-29 DIAGNOSIS — G8929 Other chronic pain: Secondary | ICD-10-CM | POA: Diagnosis not present

## 2019-11-29 MED ORDER — LEVONORGESTREL 20 MCG/24HR IU IUD: 1.0000 | INTRAUTERINE_SYSTEM | Freq: Once | INTRAUTERINE | 0 refills | Status: DC

## 2019-11-29 NOTE — Progress Notes (Signed)
    IUD Insertion Procedure Note Patient identified, informed consent performed, consent signed.   Discussed risks of irregular bleeding, cramping, infection, malpositioning, expulsion or uterine perforation of the IUD (1:1000 placements)  which may require further procedure such as laparoscopy.  IUD while effective at preventing pregnancy do not prevent transmission of sexually transmitted diseases and use of barrier methods for this purpose was discussed. Time out was performed.  Urine pregnancy test not done, patient started menses 1 week ago.  Speculum placed in the vagina.  Cervix visualized.  Cleaned with Betadine x 2.  Grasped anteriorly with a single tooth tenaculum.  Uterus sounded to 8.5 cm. IUD placed per manufacturer's recommendations.  Strings trimmed to 3 cm. Tenaculum was removed, good hemostasis noted with application of silver nitrate.  Patient tolerated procedure well.   Patient was given post-procedure instructions.  She was advised to have backup contraception for one week.  Patient was also asked to check IUD strings periodically and follow up in 4 weeks for IUD check.  Prentice Docker, MD, [facility], [facility] 11/29/2019 2:19 PM

## 2019-12-30 ENCOUNTER — Other Ambulatory Visit: Payer: Self-pay

## 2019-12-30 ENCOUNTER — Encounter: Payer: Self-pay | Admitting: Obstetrics and Gynecology

## 2019-12-30 ENCOUNTER — Ambulatory Visit (INDEPENDENT_AMBULATORY_CARE_PROVIDER_SITE_OTHER): Payer: BC Managed Care – PPO | Admitting: Obstetrics and Gynecology

## 2019-12-30 VITALS — BP 118/74 | Wt 175.0 lb

## 2019-12-30 DIAGNOSIS — R102 Pelvic and perineal pain: Secondary | ICD-10-CM | POA: Diagnosis not present

## 2019-12-30 DIAGNOSIS — N946 Dysmenorrhea, unspecified: Secondary | ICD-10-CM

## 2019-12-30 DIAGNOSIS — Z30431 Encounter for routine checking of intrauterine contraceptive device: Secondary | ICD-10-CM

## 2019-12-30 DIAGNOSIS — N9089 Other specified noninflammatory disorders of vulva and perineum: Secondary | ICD-10-CM | POA: Diagnosis not present

## 2019-12-30 DIAGNOSIS — G8929 Other chronic pain: Secondary | ICD-10-CM

## 2019-12-30 NOTE — Progress Notes (Signed)
   IUD String Check  Subjctive: [name] presents for IUD string check.  She had a Mirena placed 4 weeks ago.  Since placement of her IUD she had daily vaginal bleeding in the form of spotting.  She denies cramping or discomfort.  She has had intercourse since placement.  She has not checked the strings.  She denies any fever, chills, nausea, vomiting, or other complaints.    Objective: BP 118/74   Wt 175 lb (79.4 kg)   BMI 25.84 kg/m  Physical Exam Constitutional:      General: She is not in acute distress.    Appearance: Normal appearance.  Genitourinary:     Pelvic exam was performed with patient in the lithotomy position.     Urethra, bladder, vagina, cervix, uterus, right adnexa and left adnexa normal.        IUD strings visualized.     Uterus is retroverted.  HENT:     Head: Normocephalic and atraumatic.  Eyes:     General: No scleral icterus.    Conjunctiva/sclera: Conjunctivae normal.  Neurological:     General: No focal deficit present.     Mental Status: She is alert and oriented to person, place, and time.     Cranial Nerves: No cranial nerve deficit.  Psychiatric:        Mood and Affect: Mood normal.        Behavior: Behavior normal.        Judgment: Judgment normal.    Female chaperone was present for the entirety of the pelvic exam  Assessment: 39 y.o. year old female status post prior Mirena IUD placement 4 week ago, doing well.  Plan: 1.  The patient was given instructions to check her IUD strings monthly and call with any problems or concerns.  She should call for fevers, chills, abnormal vaginal discharge, pelvic pain, or other complaints. 2.  She will return for a annual exam in 1 year.  All questions answered.  20 minutes spent in face to face discussion with > 50% spent in counseling, management, and coordination of care for her newly-placed IUD.  Risks and benefits of IUD discussed including the risks of irregular bleeding, cramping,  infection, malpositioning, expulsion, which may require further procedures such as laparoscopy.  IUDs while effective at preventing pregnancy do not prevent transmission of sexually transmitted diseases and use of barrier methods for this purpose was discussed.  Low overall incidence of failure with 99.7% efficacy rate in typical use.    Prentice Docker, MD 12/30/2019 8:12 AM

## 2020-03-12 ENCOUNTER — Other Ambulatory Visit: Payer: Self-pay | Admitting: Family

## 2020-03-12 DIAGNOSIS — F320 Major depressive disorder, single episode, mild: Secondary | ICD-10-CM

## 2020-04-01 ENCOUNTER — Encounter: Payer: Self-pay | Admitting: Obstetrics and Gynecology

## 2020-04-01 ENCOUNTER — Other Ambulatory Visit: Payer: Self-pay

## 2020-04-01 ENCOUNTER — Ambulatory Visit: Payer: BC Managed Care – PPO | Admitting: Obstetrics and Gynecology

## 2020-04-01 VITALS — BP 137/77 | HR 60 | Wt 175.0 lb

## 2020-04-01 DIAGNOSIS — Z30432 Encounter for removal of intrauterine contraceptive device: Secondary | ICD-10-CM

## 2020-04-01 NOTE — Progress Notes (Signed)
  IUD Removal  Patient identified, informed consent performed, consent signed.  Patient was in the dorsal lithotomy position, normal external genitalia was noted.  A speculum was placed in the patient's vagina, normal discharge was noted, no lesions. The cervix was visualized, no lesions, no abnormal discharge.  The strings of the IUD were grasped and pulled using ring forceps. The IUD was removed in its entirety. Patient tolerated the procedure well.    Routine preventative health maintenance measures emphasized.    Future steps in the care of her issue were discussed briefly. For today, she simply wanted the IUD removed. I suspect there may be adenomyosis and/or endometriosis.    Follow up for routine care or as needed.   Prentice Docker, MD, [facility], [facility] 04/01/2020 10:03 AM

## 2020-04-17 DIAGNOSIS — Z1152 Encounter for screening for COVID-19: Secondary | ICD-10-CM | POA: Diagnosis not present

## 2020-04-17 DIAGNOSIS — Z03818 Encounter for observation for suspected exposure to other biological agents ruled out: Secondary | ICD-10-CM | POA: Diagnosis not present

## 2020-05-04 IMAGING — MG MM DIGITAL SCREENING BILAT W/ TOMO W/ CAD
8 series · 8 of 24 positions shown · non-contrast
Comparison: None.

CLINICAL DATA: Screening.

EXAM:
DIGITAL SCREENING BILATERAL MAMMOGRAM WITH TOMO AND CAD

[L CC synth-2D]
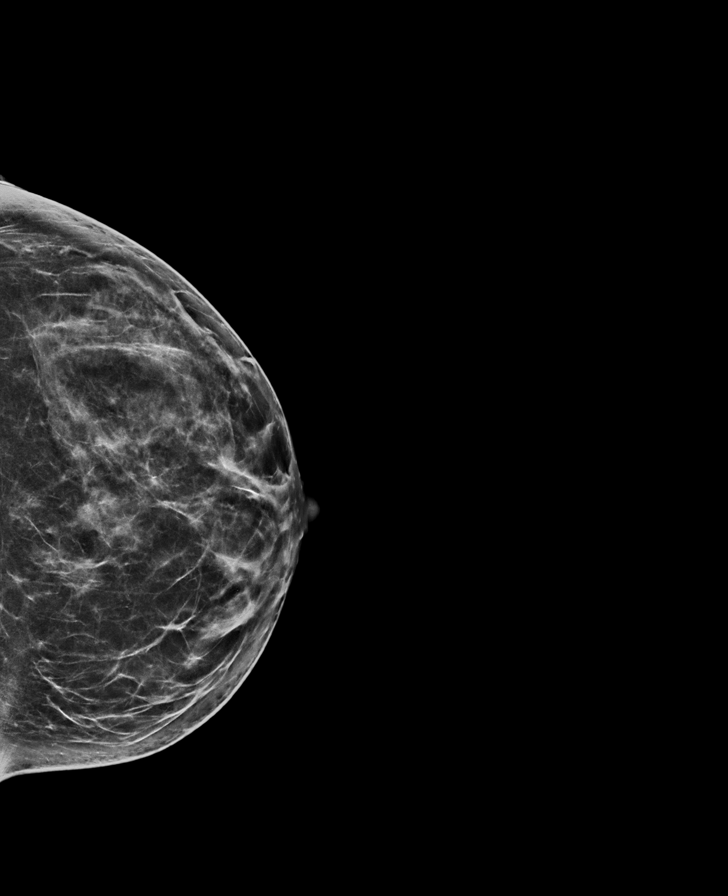

[L MLO synth-2D]
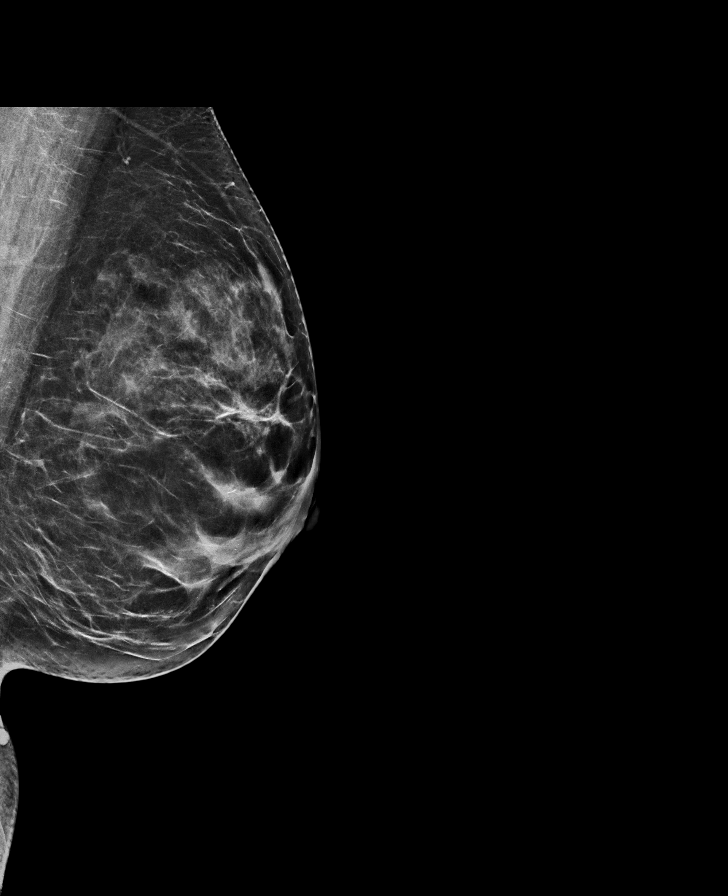

[R MLO synth-2D]
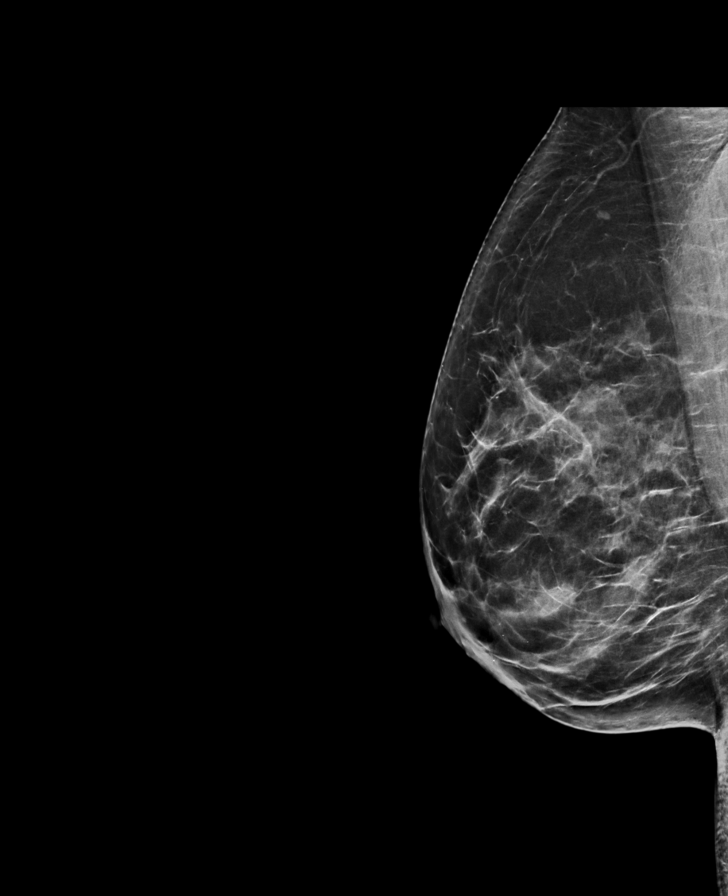

[R CC synth-2D]
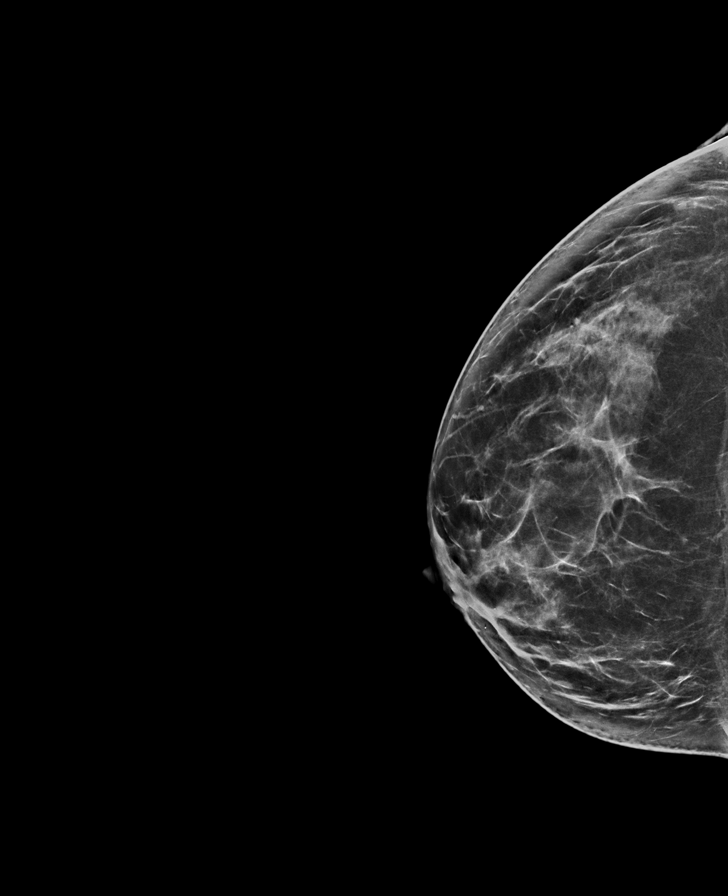

[L MLO tomo · tomo slice 39/76.0]
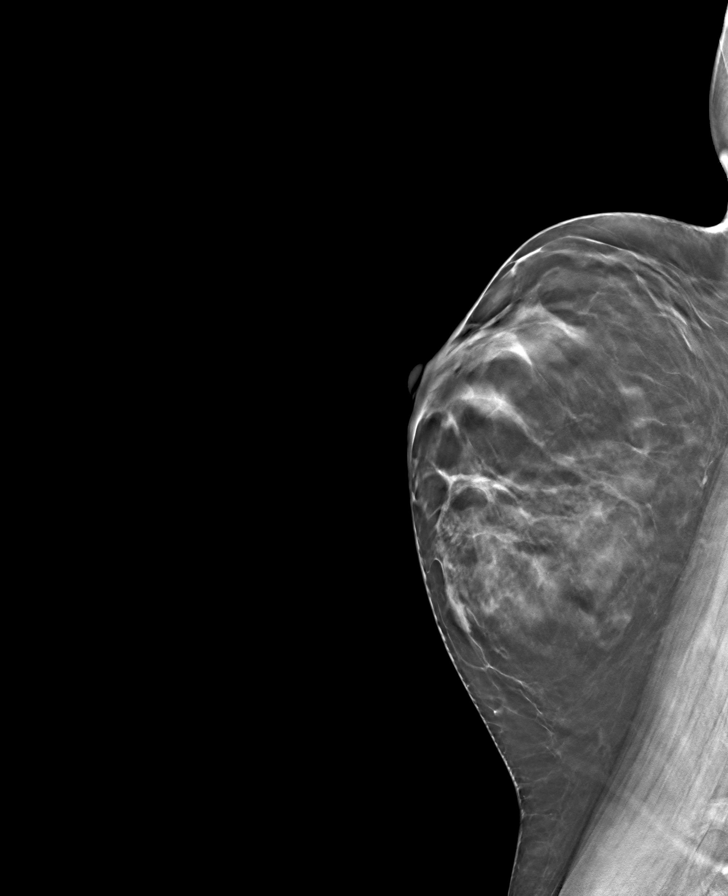

[R CC tomo · tomo slice 38/75.0]
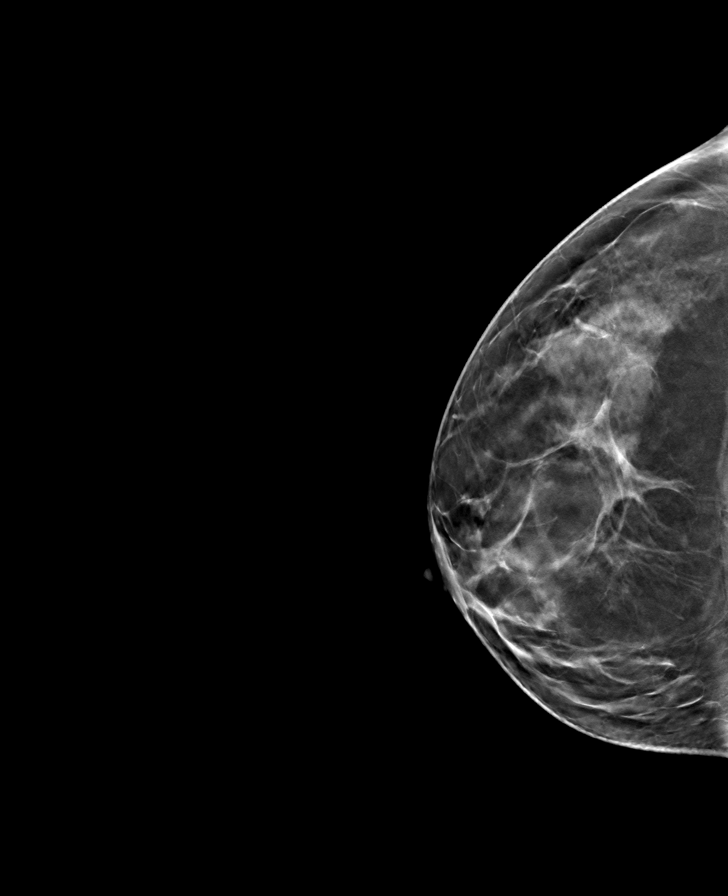

[R MLO tomo · tomo slice 39/76.0]
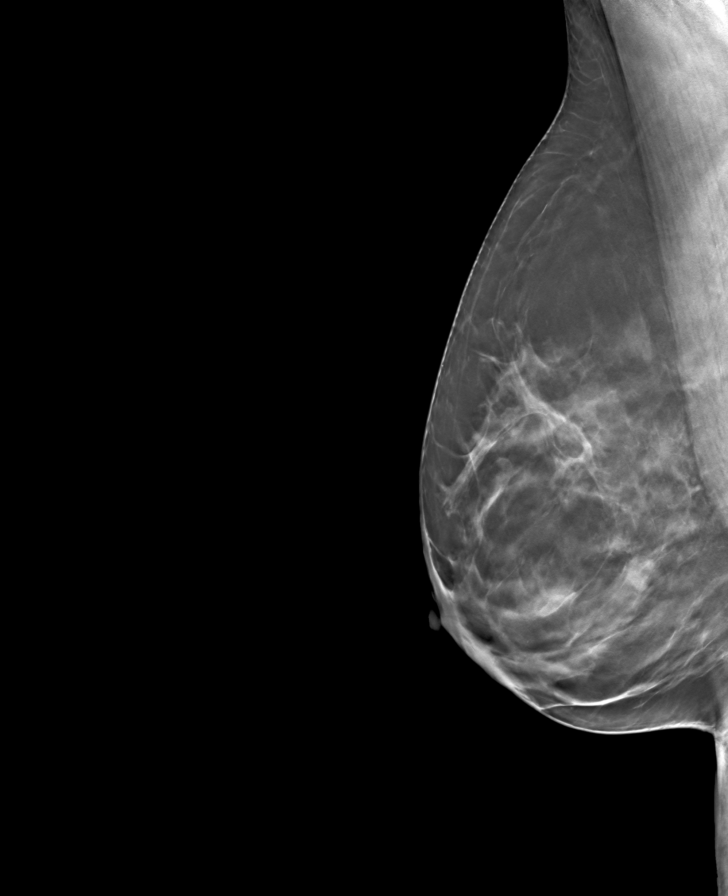

[L CC tomo · tomo slice 37/72.0]
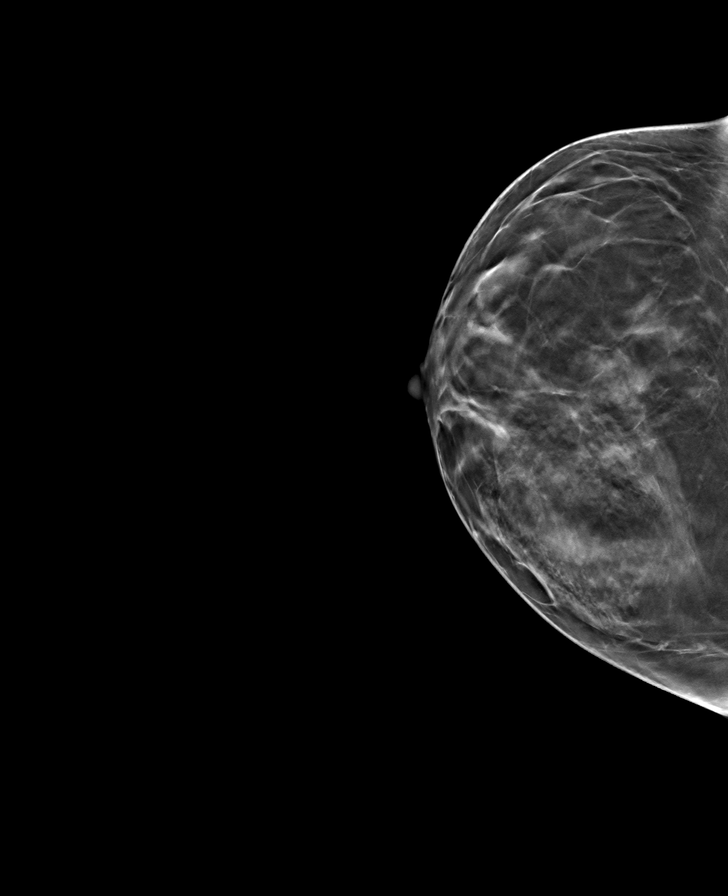

[8 of 24 positions shown; findings below may reference images not displayed]

ACR Breast Density Category c: The breast tissue is heterogeneously
dense, which may obscure small masses
FINDINGS: There are no findings suspicious for malignancy. Images were
processed with CAD.
IMPRESSION: No mammographic evidence of malignancy. A result letter of this
screening mammogram will be mailed directly to the patient.

RECOMMENDATION:
Screening mammogram at age 39. (Code:NM-[id number])

BI-RADS CATEGORY  1: Negative.

## 2020-08-30 ENCOUNTER — Other Ambulatory Visit: Payer: Self-pay | Admitting: Family

## 2020-08-30 DIAGNOSIS — N926 Irregular menstruation, unspecified: Secondary | ICD-10-CM

## 2020-09-16 ENCOUNTER — Other Ambulatory Visit: Payer: Self-pay | Admitting: Family

## 2020-09-28 ENCOUNTER — Encounter: Payer: Self-pay | Admitting: Family

## 2020-10-22 ENCOUNTER — Other Ambulatory Visit: Payer: Self-pay | Admitting: Family

## 2020-10-22 DIAGNOSIS — F320 Major depressive disorder, single episode, mild: Secondary | ICD-10-CM

## 2020-10-28 ENCOUNTER — Ambulatory Visit: Payer: Self-pay | Admitting: Family

## 2020-10-30 ENCOUNTER — Encounter: Payer: Self-pay | Admitting: Family

## 2020-10-30 ENCOUNTER — Ambulatory Visit: Payer: BC Managed Care – PPO | Admitting: Family

## 2020-10-30 ENCOUNTER — Other Ambulatory Visit: Payer: Self-pay

## 2020-10-30 VITALS — BP 128/82 | HR 65 | Temp 98.1°F | Ht 69.0 in | Wt 180.2 lb

## 2020-10-30 DIAGNOSIS — Z1231 Encounter for screening mammogram for malignant neoplasm of breast: Secondary | ICD-10-CM

## 2020-10-30 DIAGNOSIS — F320 Major depressive disorder, single episode, mild: Secondary | ICD-10-CM

## 2020-10-30 MED ORDER — SERTRALINE HCL 100 MG PO TABS: 1.0000 | ORAL_TABLET | Freq: Every day | ORAL | 4 refills | Status: DC

## 2020-10-30 NOTE — Patient Instructions (Signed)
Nice to see you!  Essentials for good sleep:   #1 Exercise #2 Limit Caffeine ( no caffeine after lunch) #3 No smart phones, TV prior to bed -- BLUE light is VERY activating and send the brain an 'awake message.'  #4 Go to bed at same time of night each night and get up at same time of day.  #5 Take 0.5 to 5mg  melatonin at 7pm with dinner -this is when natural melatonin will start to increase #6 May try over the counter Unisom for sleep aid

## 2020-10-30 NOTE — Progress Notes (Signed)
Subjective:    Patient ID: Julia Nolan, female    DOB: 08/16/1980, 40 y.o.   MRN: [id number]  CC: Julia Nolan is a 40 y.o. female who presents today for follow up.   HPI: She feels well today. No complaints.   Feels well on zoloft. Depression is controlled. Has 3 boys which keep her busy. She reports occasional interrupted sleep with children or trouble sleeping. She hasnt tried any OTC medication for this.   No si/hi.  Due mammogram   HISTORY:  Past Medical History:  Diagnosis Date  . Anxiety   . Depression   . Factor 5 Leiden mutation, heterozygous [facility])    Past Surgical History:  Procedure Laterality Date  . INTRAUTERINE DEVICE (IUD) INSERTION    . NO PAST SURGERIES     Family History  Problem Relation Age of Onset  . Depression Mother   . Hyperlipidemia Mother   . Endometriosis Mother   . Hyperlipidemia Father   . Depression Father   . Prostate cancer Father   . Depression Sister   . Stroke Maternal Grandmother   . Endometriosis Maternal Grandmother   . Arthritis Maternal Grandfather   . Diabetes Maternal Grandfather   . Hyperlipidemia Paternal Grandmother   . Hyperlipidemia Paternal Grandfather   . Hypertension Paternal Grandfather   . Kidney disease Paternal Grandfather   . Colon cancer Neg Hx   . Breast cancer Neg Hx     Allergies: Patient has no known allergies. Current Outpatient Medications on File Prior to Visit  Medication Sig Dispense Refill  . cholecalciferol (VITAMIN D3) 25 MCG (1000 UT) tablet Take 2,000 Units by mouth daily.    . ondansetron (ZOFRAN-ODT) 4 MG disintegrating tablet TAKE 1 TABLET BY MOUTH EVERY 8 HOURS AS NEEDED FOR NAUSEA AND VOMITING 3 tablet 19  . Probiotic Product (PROBIOTIC-10 PO) Take by mouth.     No current facility-administered medications on file prior to visit.    Social History   Tobacco Use  . Smoking status: Never Smoker  . Smokeless tobacco: Never Used  Vaping Use  . Vaping Use: Never used  Substance  Use Topics  . Alcohol use: Yes  . Drug use: Never    Review of Systems  Constitutional: Negative for chills and fever.  Respiratory: Negative for cough.   Cardiovascular: Negative for chest pain and palpitations.  Gastrointestinal: Negative for nausea and vomiting.  Psychiatric/Behavioral: Positive for sleep disturbance. Negative for suicidal ideas. The patient is not nervous/anxious.       Objective:    BP 128/82   Pulse 65   Temp 98.1 F (36.7 C)   Ht 5\' 9"  (1.753 m)   Wt 180 lb 3.2 oz (81.7 kg)   SpO2 99%   BMI 26.61 kg/m  BP Readings from Last 3 Encounters:  10/30/20 128/82  04/01/20 137/77  12/30/19 118/74   Wt Readings from Last 3 Encounters:  10/30/20 180 lb 3.2 oz (81.7 kg)  04/01/20 175 lb (79.4 kg)  12/30/19 175 lb (79.4 kg)   Depression screen [facility] 2/9 10/30/2020 07/15/2019 02/04/2019  Decreased Interest 0 0 0  Down, Depressed, Hopeless 0 0 0  PHQ - 2 Score 0 0 0  Altered sleeping 2 0 -  Tired, decreased energy 1 0 -  Change in appetite 0 0 -  Feeling bad or failure about yourself  0 0 -  Trouble concentrating 0 0 -  Moving slowly or fidgety/restless 0 0 -  Suicidal thoughts 0 0 -  PHQ-9 Score 3 0 -  Difficult doing work/chores Somewhat difficult Not difficult at all -    Physical Exam Vitals reviewed.  Constitutional:      Appearance: She is well-developed.  Eyes:     Conjunctiva/sclera: Conjunctivae normal.  Cardiovascular:     Rate and Rhythm: Normal rate and regular rhythm.     Pulses: Normal pulses.     Heart sounds: Normal heart sounds.  Pulmonary:     Effort: Pulmonary effort is normal.     Breath sounds: Normal breath sounds. No wheezing, rhonchi or rales.  Skin:    General: Skin is warm and dry.  Neurological:     Mental Status: She is alert.  Psychiatric:        Speech: Speech normal.        Behavior: Behavior normal.        Thought Content: Thought content normal.        Assessment & Plan:   Problem List Items Addressed  This Visit      Other   Depression, major, single episode, mild ([place])    Overall controlled. She declines increasing zoloft to see if aids with sleep quality. Advised she may trial melatonin or prn OTC unisom. Continue zoloft 100mg .       Relevant Medications   sertraline (ZOLOFT) 100 MG tablet    Other Visit Diagnoses    Encounter for screening mammogram for malignant neoplasm of breast    -  Primary   Relevant Orders   MM 3D SCREEN BREAST BILATERAL       I have discontinued Shernita Lovvorn's aspirin EC. I have also changed her sertraline. Additionally, I am having her maintain her Probiotic Product (PROBIOTIC-10 PO), cholecalciferol, and ondansetron.   Meds ordered this encounter  Medications  . sertraline (ZOLOFT) 100 MG tablet    Sig: Take 1 tablet (100 mg total) by mouth daily.    Dispense:  90 tablet    Refill:  4    Order Specific Question:   Supervising Provider    Answer:   Crecencio Mc [[id number]]    Return precautions given.   Risks, benefits, and alternatives of the medications and treatment plan prescribed today were discussed, and patient expressed understanding.   Education regarding symptom management and diagnosis given to patient on AVS.  Continue to follow with Burnard Hawthorne, FNP for routine health maintenance.   [name] and I agreed with plan.   Mable Paris, FNP

## 2020-11-02 NOTE — Assessment & Plan Note (Signed)
Overall controlled. She declines increasing zoloft to see if aids with sleep quality. Advised she may trial melatonin or prn OTC unisom. Continue zoloft 100mg .

## 2020-11-27 ENCOUNTER — Other Ambulatory Visit: Payer: Self-pay

## 2020-11-27 ENCOUNTER — Ambulatory Visit
Admission: RE | Admit: 2020-11-27 | Discharge: 2020-11-27 | Disposition: A | Discharge status: Home / Self Care | Payer: BC Managed Care – PPO | Source: Ambulatory Visit | Attending: Family | Admitting: Family

## 2020-11-27 DIAGNOSIS — Z1231 Encounter for screening mammogram for malignant neoplasm of breast: Secondary | ICD-10-CM | POA: Insufficient documentation

## 2021-01-29 ENCOUNTER — Telehealth: Payer: BC Managed Care – PPO | Admitting: Family Medicine

## 2021-01-29 ENCOUNTER — Encounter: Payer: Self-pay | Admitting: Family

## 2021-01-29 DIAGNOSIS — R059 Cough, unspecified: Secondary | ICD-10-CM | POA: Diagnosis not present

## 2021-01-29 DIAGNOSIS — U071 COVID-19: Secondary | ICD-10-CM | POA: Diagnosis not present

## 2021-01-29 MED ORDER — MOLNUPIRAVIR EUA 200MG CAPSULE: 4.0000 | ORAL_CAPSULE | Freq: Two times a day (BID) | ORAL | 0 refills | Status: AC

## 2021-01-29 NOTE — Progress Notes (Signed)
[name], bartle are scheduled for a virtual visit with your provider today.    Just as we do with appointments in the office, we must obtain your consent to participate.  Your consent will be active for this visit and any virtual visit you may have with one of our providers in the next 365 days.    If you have a MyChart account, I can also send a copy of this consent to you electronically.  All virtual visits are billed to your insurance company just like a traditional visit in the office.  As this is a virtual visit, video technology does not allow for your provider to perform a traditional examination.  This may limit your provider's ability to fully assess your condition.  If your provider identifies any concerns that need to be evaluated in person or the need to arrange testing such as labs, EKG, etc, we will make arrangements to do so.    Although advances in technology are sophisticated, we cannot ensure that it will always work on either your end or our end.  If the connection with a video visit is poor, we may have to switch to a telephone visit.  With either a video or telephone visit, we are not always able to ensure that we have a secure connection.   I need to obtain your verbal consent now.   Are you willing to proceed with your visit today?   Julia Nolan has provided verbal consent on 01/29/2021 for a virtual visit (video or telephone).   Perlie Mayo, NP 01/29/2021  12:00 PM   Date:  01/29/2021   ID:  Julia Nolan, DOB 12-14-1980, MRN [id number]  Patient Location: Home Provider Location: Home Office   Participants: Patient and Provider for Visit and Wrap up  Method of visit: Video  Location of Patient: Home Location of Provider: Home Office Consent was obtain for visit over the video. Services rendered by provider: Visit was performed via video  A video enabled telemedicine application was used and I verified that I am speaking with the correct person using two  identifiers.  PCP:  Burnard Hawthorne, FNP   Chief Complaint:  sinus symptoms , COVID +  History of Present Illness:    Julia Nolan is a 40 y.o. female with history as stated below. Presents video telehealth for an acute care visit COVID + on 7/6, symptoms started on the 7/5, each day symptoms have worsen. She is Vaccinated x 2 and Boosted in Nov 2021  Onset of symptoms was 7/5 and symptoms have been persistent and include:    Sinus pressure in face, cough due to drainage, ear pain-bilateral. Overall feeling worse as the time goes on.  Denies having fevers, chills, shortness of breath, chest pain, ear pain,. Exposure to covid due to son. Modifying factors include: mucinex and rotation of Tylenol and ibuprofen.  No other aggravating or relieving factors.  No other c/o.  Past Medical, Surgical, Social History, Allergies, and Medications have been Reviewed.  Past Medical History:  Diagnosis Date   Anxiety    Depression    Factor 5 Leiden mutation, heterozygous ([place])     No outpatient medications have been marked as taking for the 01/29/21 encounter (Appointment) with [place].     Allergies:   Patient has no known allergies.   ROS See HPI for history of present illness.  Physical Exam Constitutional:      Appearance: She is ill-appearing.  HENT:     Head:  Normocephalic.  Eyes:     Conjunctiva/sclera: Conjunctivae normal.  Pulmonary:     Effort: Pulmonary effort is normal.     Comments: No shortness of breath in conversation No cough noted, hoarseness present Skin:    Coloration: Skin is not jaundiced.  Neurological:     General: No focal deficit present.     Mental Status: She is alert.              A&P 1. COVID-19 -OTC measures reviewed -Antiviral due to BMI ordered -Encouraged rest and hydration - Reviewed side effects, risks and benefits of medication.   AVS includes info  Patient acknowledged agreement and understanding of the plan.    -  molnupiravir EUA 200 mg CAPS; Take 4 capsules (800 mg total) by mouth 2 (two) times daily for 5 days.  Dispense: 40 capsule; Refill: 0  2. Cough Cough mild- no script needed, advised OTC measures as needed Patient acknowledged agreement and understanding of the plan.      Time:   Today, I have spent 10 minutes with the patient with telehealth technology discussing the above problems, reviewing the chart, previous notes, medications and orders.   Medication Changes: No orders of the defined types were placed in this encounter.    Disposition:  Follow up PRN Signed, Perlie Mayo, NP  01/29/2021 12:00 PM

## 2021-01-29 NOTE — Patient Instructions (Signed)
To keep from spreading the disease you should: Stay home and limit contact with other people as much as possible. Wash your hands frequently. Cover your coughs and sneezes with a tissue, and throw used tissues in the trash.   Clean and disinfect frequently touched surfaces and objects.     Take care of yourself by: Staying home Resting Drinking fluids Take fever-reducing medications (Tylenol/Acetaminophen and Ibuprofen)   For more information on the disease go to the Centers for Disease Control and Prevention website        Can take to lessen severity: Vit C 500mg twice daily Quercertin 250-500mg twice daily Zinc 75-100mg daily Melatonin 3-6 mg at bedtime Vit D3 1000-2000 IU daily Aspirin 81 mg daily with food Optional: Famotidine 20mg daily Also can add tylenol/ibuprofen as needed for fevers and body aches May add Mucinex or Mucinex DM as needed for cough/congestion     10 Things You Can Do to Manage Your COVID-19 Symptoms at Home If you have possible or confirmed COVID-19: Stay home except to get medical care. Monitor your symptoms carefully. If your symptoms get worse, call your healthcare provider immediately. Get rest and stay hydrated. If you have a medical appointment, call the healthcare provider ahead of time and tell them that you have or may have COVID-19. For medical emergencies, call 911 and notify the dispatch personnel that you have or may have COVID-19. Cover your cough and sneezes with a tissue or use the inside of your elbow. Wash your hands often with soap and water for at least 20 seconds or clean your hands with an alcohol-based hand sanitizer that contains at least 60% alcohol. As much as possible, stay in a specific room and away from other people in your home. Also, you should use a separate bathroom, if available. If you need to be around other people in or outside of the home, wear a mask. Avoid sharing personal items with other people in your  household, like dishes, towels, and bedding. Clean all surfaces that are touched often, like counters, tabletops, and doorknobs. Use household cleaning sprays or wipes according to the label instructions. cdc.gov/coronavirus 02/07/2020 This information is not intended to replace advice given to you by your health care provider. Make sure you discuss any questions you have with your health care provider. Document Revised: 05/25/2020 Document Reviewed: 05/25/2020 Elsevier Patient Education  2021 Elsevier Inc.  Molnupiravir Oral Capsules What is this medication? MOLNUPIRAVIR (mol nue pir a vir) treats COVID-19. It is an antiviral medication. It may decrease the risk of developing severe symptoms of COVID-19. It may also decrease the chance of going to the hospital. This medication is not approved by the FDA. The FDA has authorized emergency use of thismedication during the COVID-19 pandemic. This medicine may be used for other purposes; ask your health care provider orpharmacist if you have questions. What should I tell my care team before I take this medication? They need to know if you have any of these conditions: Any allergies Any serious illness An unusual or allergic reaction to molnupiravir, other medications, foods, dyes, or preservatives Pregnant or trying to get pregnant Breast-feeding How should I use this medication? Take this medication by mouth with water. Take it as directed on the prescription label at the same time every day. Do not cut, crush or chew this medication. Swallow the capsules whole. You can take it with or without food. If it upsets your stomach, take it with food. Take all of this medication   unless your care team tells you to stop it early. Keep taking it even if youthink you are better. Talk to your care team about the use of this medication in children. Specialcare may be needed. Overdosage: If you think you have taken too much of this medicine contact apoison  control center or emergency room at once. NOTE: This medicine is only for you. Do not share this medicine with others. What if I miss a dose? If you miss a dose, take it as soon as you can unless it is more than 10 hours late. If it is more than 10 hours late, skip the missed dose. Take the next dose at the normal time. Do not take extra or 2 doses at the same time to makeup for the missed dose. What may interact with this medication? Interactions have not been studied. This list may not describe all possible interactions. Give your health care provider a list of all the medicines, herbs, non-prescription drugs, or dietary supplements you use. Also tell them if you smoke, drink alcohol, or use illegaldrugs. Some items may interact with your medicine. What should I watch for while using this medication? Your condition will be monitored carefully while you are receiving this medication. Visit your care team for regular checkups. Tell your care team ifyour symptoms do not start to get better or if they get worse. Do not become pregnant while taking this medication. You may need a pregnancy test before starting this medication. Women must use a reliable form of birth control while taking this medication and for 4 days after stopping the medication. Women should inform their care team if they wish to become pregnant or think they might be pregnant. Men should not father a child while taking this medication and for 3 months after stopping it. There is potential for serious harm to an unborn child. Talk to your care team for more information. Do not breast-feed an infant while taking this medication and for 4 days afterstopping the medication. What side effects may I notice from receiving this medication? Side effects that you should report to your care team as soon as possible: Allergic reactions-skin rash, itching, hives, swelling of the face, lips, tongue, or throat Side effects that usually do not require  medical attention (report these toyour care team if they continue or are bothersome): Diarrhea Dizziness Nausea This list may not describe all possible side effects. Call your doctor for medical advice about side effects. You may report side effects to FDA at1-800-FDA-1088. Where should I keep my medication? Keep out of the reach of children and pets. Store at room temperature between 20 and 25 degrees C (68 and 77 degrees F).Get rid of any unused medication after the expiration date. To get rid of medications that are no longer needed or have expired: Take the medication to a medication take-back program. Check with your pharmacy or law enforcement to find a location. If you cannot return the medication, check the label or package insert to see if the medication should be thrown out in the garbage or flushed down the toilet. If you are not sure, ask your care team. If it is safe to put it in the trash, take the medication out of the container. Mix the medication with cat litter, dirt, coffee grounds, or other unwanted substance. Seal the mixture in a bag or container. Put it in the trash. NOTE: This sheet is a summary. It may not cover all possible information. If you have questions   about this medicine, talk to your doctor, pharmacist, orhealth care provider.  2022 Elsevier/Gold Standard (2020-07-20 16:16:01)   

## 2021-02-08 ENCOUNTER — Telehealth: Payer: BC Managed Care – PPO | Admitting: Family

## 2021-03-12 DIAGNOSIS — Z124 Encounter for screening for malignant neoplasm of cervix: Secondary | ICD-10-CM | POA: Diagnosis not present

## 2021-03-12 DIAGNOSIS — N921 Excessive and frequent menstruation with irregular cycle: Secondary | ICD-10-CM | POA: Diagnosis not present

## 2021-03-12 DIAGNOSIS — Z01419 Encounter for gynecological examination (general) (routine) without abnormal findings: Secondary | ICD-10-CM | POA: Diagnosis not present

## 2021-03-12 DIAGNOSIS — Z6826 Body mass index (BMI) 26.0-26.9, adult: Secondary | ICD-10-CM | POA: Diagnosis not present

## 2021-03-15 DIAGNOSIS — N921 Excessive and frequent menstruation with irregular cycle: Secondary | ICD-10-CM | POA: Diagnosis not present

## 2021-06-24 NOTE — Telephone Encounter (Signed)
Mirena rcvd/charged 11/29/2019

## 2021-10-06 DIAGNOSIS — G8929 Other chronic pain: Secondary | ICD-10-CM | POA: Diagnosis not present

## 2021-10-06 DIAGNOSIS — R102 Pelvic and perineal pain: Secondary | ICD-10-CM | POA: Diagnosis not present

## 2021-10-06 DIAGNOSIS — N8000 Endometriosis of the uterus, unspecified: Secondary | ICD-10-CM | POA: Diagnosis not present

## 2021-10-06 DIAGNOSIS — Z842 Family history of other diseases of the genitourinary system: Secondary | ICD-10-CM | POA: Diagnosis not present

## 2021-10-23 ENCOUNTER — Other Ambulatory Visit: Payer: Self-pay | Admitting: Family

## 2021-10-23 DIAGNOSIS — N926 Irregular menstruation, unspecified: Secondary | ICD-10-CM

## 2021-10-25 ENCOUNTER — Encounter: Payer: Self-pay | Admitting: Family

## 2021-11-08 ENCOUNTER — Encounter: Payer: Self-pay | Admitting: Family

## 2021-11-08 ENCOUNTER — Ambulatory Visit: Payer: BC Managed Care – PPO | Admitting: Family

## 2021-11-08 VITALS — BP 128/70 | HR 82 | Temp 97.5°F | Ht 69.0 in | Wt 186.1 lb

## 2021-11-08 DIAGNOSIS — F320 Major depressive disorder, single episode, mild: Secondary | ICD-10-CM

## 2021-11-08 DIAGNOSIS — Z1231 Encounter for screening mammogram for malignant neoplasm of breast: Secondary | ICD-10-CM

## 2021-11-08 NOTE — Progress Notes (Signed)
? ?Subjective:  ? ? Patient ID: Julia Nolan, female    DOB: April 05, 1981, 42 y.o.   MRN: [id number] ? ?CC: Julia Nolan is a 41 y.o. female who presents today for follow up.  ? ?HPI: Feels well today.  No new complaints ? ?Depression-compliant with Zoloft 100 mg .  Medication is working well for her.  No breakthrough symptoms.  Denies SI/HI.  Sleeping well ? ?mammogram due ? ?Follows with GYN, Dr Julia Nolan for chronic pelvic pain.  ? ?Scheduled hysteroscopy ?Declines labs today ? ?HISTORY:  ?Past Medical History:  ?Diagnosis Date  ? Anxiety   ? Depression   ? Factor 5 Leiden mutation, heterozygous ([place])   ? ?Past Surgical History:  ?Procedure Laterality Date  ? INTRAUTERINE DEVICE (IUD) INSERTION    ? NO PAST SURGERIES    ? ?Family History  ?Problem Relation Age of Onset  ? Depression Mother   ? Hyperlipidemia Mother   ? Endometriosis Mother   ? Hyperlipidemia Father   ? Depression Father   ? Prostate cancer Father   ? Depression Sister   ? Stroke Maternal Grandmother   ? Endometriosis Maternal Grandmother   ? Arthritis Maternal Grandfather   ? Diabetes Maternal Grandfather   ? Hyperlipidemia Paternal Grandmother   ? Hyperlipidemia Paternal Grandfather   ? Hypertension Paternal Grandfather   ? Kidney disease Paternal Grandfather   ? Colon cancer Neg Hx   ? Breast cancer Neg Hx   ? ? ?Allergies: Patient has no known allergies. ?Current Outpatient Medications on File Prior to Visit  ?Medication Sig Dispense Refill  ? Elagolix Sodium (ORILISSA) 150 MG TABS Take 150 mg by mouth daily.    ? ondansetron (ZOFRAN-ODT) 4 MG disintegrating tablet TAKE 1 TABLET BY MOUTH EVERY 8 HOURS AS NEEDED FOR NAUSEA AND VOMITING 3 tablet 19  ? sertraline (ZOLOFT) 100 MG tablet Take 1 tablet (100 mg total) by mouth daily. 90 tablet 4  ? cholecalciferol (VITAMIN D3) 25 MCG (1000 UT) tablet Take 2,000 Units by mouth daily. (Patient not taking: Reported on 11/08/2021)    ? Probiotic Product (PROBIOTIC-10 PO) Take by mouth. (Patient not taking:  Reported on 11/08/2021)    ? ?No current facility-administered medications on file prior to visit.  ? ? ?Social History  ? ?Tobacco Use  ? Smoking status: Never  ? Smokeless tobacco: Never  ?Vaping Use  ? Vaping Use: Never used  ?Substance Use Topics  ? Alcohol use: Yes  ? Drug use: Never  ? ? ?Review of Systems  ?Constitutional:  Negative for chills and fever.  ?Respiratory:  Negative for cough.   ?Cardiovascular:  Negative for chest pain and palpitations.  ?Gastrointestinal:  Negative for nausea and vomiting.  ?Genitourinary:  Positive for pelvic pain (chronic).  ?   ?Objective:  ?  ?BP 128/70 (BP Location: Left Arm, Patient Position: Sitting, Cuff Size: Normal)   Pulse 82   Temp (!) 97.5 ?F (36.4 ?C) (Oral)   Ht '5\' 9"'$  (1.753 m)   Wt 186 lb 1.6 oz (84.4 kg)   LMP 10/23/2021 (Approximate)   SpO2 98%   BMI 27.48 kg/m?  ?BP Readings from Last 3 Encounters:  ?11/08/21 128/70  ?10/30/20 128/82  ?04/01/20 137/77  ? ?Wt Readings from Last 3 Encounters:  ?11/08/21 186 lb 1.6 oz (84.4 kg)  ?10/30/20 180 lb 3.2 oz (81.7 kg)  ?04/01/20 175 lb (79.4 kg)  ? ? ?Physical Exam ?Vitals reviewed.  ?Constitutional:   ?   Appearance: She is well-developed.  ?  Eyes:  ?   Conjunctiva/sclera: Conjunctivae normal.  ?Cardiovascular:  ?   Rate and Rhythm: Normal rate and regular rhythm.  ?   Pulses: Normal pulses.  ?   Heart sounds: Normal heart sounds.  ?Pulmonary:  ?   Effort: Pulmonary effort is normal.  ?   Breath sounds: Normal breath sounds. No wheezing, rhonchi or rales.  ?Skin: ?   General: Skin is warm and dry.  ?Neurological:  ?   Mental Status: She is alert.  ?Psychiatric:     ?   Speech: Speech normal.     ?   Behavior: Behavior normal.     ?   Thought Content: Thought content normal.  ? ? ?   ?Assessment & Plan:  ? ?Problem List Items Addressed This Visit   ? ?  ? Other  ? Depression, major, single episode, mild ([place])  ?  Chronic, stable.  Continue Zoloft 100 mg ? ?  ?  ? ?Other Visit Diagnoses   ? ? Encounter for  screening mammogram for malignant neoplasm of breast    -  Primary  ? Relevant Orders  ? MM 3D SCREEN BREAST BILATERAL  ? ?  ? ?Note: Patient will schedule mammogram ? ?I am having Julia Nolan maintain her Probiotic Product (PROBIOTIC-10 PO), cholecalciferol, ondansetron, sertraline, and Orilissa. ? ? ?No orders of the defined types were placed in this encounter. ? ? ?Return precautions given.  ? ?Risks, benefits, and alternatives of the medications and treatment plan prescribed today were discussed, and patient expressed understanding.  ? ?Education regarding symptom management and diagnosis given to patient on AVS. ? ?Continue to follow with Burnard Hawthorne, FNP for routine health maintenance.  ? ?[name] and I agreed with plan.  ? ?Mable Paris, FNP ? ? ? ?

## 2021-11-08 NOTE — Progress Notes (Signed)
Mammogram referral placed also ?

## 2021-11-08 NOTE — Assessment & Plan Note (Signed)
Chronic, stable.  Continue Zoloft 100 mg ?

## 2021-11-08 NOTE — Patient Instructions (Signed)
Please let me know if vaginal bleeding persists   please call  and schedule your 3D mammogram and /or bone density scan as we discussed.   [facility]  ( new location in 2023)  [address], [place], [place] [address]  [place], [place]  [phone]   I have ordered transvaginal ultrasound.  Let us know if you dont hear back within a week in regards to an appointment being scheduled.   So that you are aware, if you are [facility] MyChart user , please pay attention to your MyChart messages as you may receive a MyChart message with a phone number to call and schedule this test/appointment own your own from our referral coordinator. This is a new process so I do not want you to miss this message.  If you are not a MyChart user, you will receive a phone call.   

## 2021-11-09 ENCOUNTER — Other Ambulatory Visit: Payer: Self-pay | Admitting: Family

## 2021-11-09 DIAGNOSIS — F320 Major depressive disorder, single episode, mild: Secondary | ICD-10-CM

## 2021-11-23 ENCOUNTER — Other Ambulatory Visit: Payer: Self-pay | Admitting: Family

## 2021-11-23 DIAGNOSIS — F320 Major depressive disorder, single episode, mild: Secondary | ICD-10-CM

## 2021-12-06 ENCOUNTER — Encounter: Payer: Self-pay | Admitting: Family

## 2021-12-06 ENCOUNTER — Other Ambulatory Visit: Payer: Self-pay

## 2021-12-06 DIAGNOSIS — N926 Irregular menstruation, unspecified: Secondary | ICD-10-CM

## 2021-12-06 MED ORDER — ONDANSETRON 4 MG PO TBDP: ORAL_TABLET | ORAL | 1 refills | Status: DC

## 2021-12-13 ENCOUNTER — Ambulatory Visit
Admission: RE | Admit: 2021-12-13 | Discharge: 2021-12-13 | Disposition: A | Discharge status: Home / Self Care | Payer: BC Managed Care – PPO | Source: Ambulatory Visit | Attending: Family | Admitting: Family

## 2021-12-13 DIAGNOSIS — Z1231 Encounter for screening mammogram for malignant neoplasm of breast: Secondary | ICD-10-CM | POA: Insufficient documentation

## 2021-12-14 ENCOUNTER — Other Ambulatory Visit: Payer: Self-pay | Admitting: Family

## 2021-12-14 DIAGNOSIS — R928 Other abnormal and inconclusive findings on diagnostic imaging of breast: Secondary | ICD-10-CM

## 2021-12-14 DIAGNOSIS — N63 Unspecified lump in unspecified breast: Secondary | ICD-10-CM

## 2021-12-17 DIAGNOSIS — N803 Endometriosis of pelvic peritoneum, unspecified: Secondary | ICD-10-CM | POA: Diagnosis not present

## 2021-12-17 DIAGNOSIS — G8929 Other chronic pain: Secondary | ICD-10-CM | POA: Diagnosis not present

## 2021-12-17 DIAGNOSIS — R102 Pelvic and perineal pain: Secondary | ICD-10-CM | POA: Diagnosis not present

## 2021-12-22 ENCOUNTER — Ambulatory Visit
Admission: RE | Admit: 2021-12-22 | Discharge: 2021-12-22 | Disposition: A | Discharge status: Home / Self Care | Payer: BC Managed Care – PPO | Source: Ambulatory Visit | Attending: Family | Admitting: Family

## 2021-12-22 DIAGNOSIS — N63 Unspecified lump in unspecified breast: Secondary | ICD-10-CM | POA: Diagnosis not present

## 2021-12-22 DIAGNOSIS — R928 Other abnormal and inconclusive findings on diagnostic imaging of breast: Secondary | ICD-10-CM | POA: Diagnosis not present

## 2021-12-22 DIAGNOSIS — N6002 Solitary cyst of left breast: Secondary | ICD-10-CM | POA: Diagnosis not present

## 2022-03-14 DIAGNOSIS — M67813 Other specified disorders of tendon, right shoulder: Secondary | ICD-10-CM | POA: Diagnosis not present

## 2022-04-05 DIAGNOSIS — M7592 Shoulder lesion, unspecified, left shoulder: Secondary | ICD-10-CM | POA: Diagnosis not present

## 2022-04-06 DIAGNOSIS — M25512 Pain in left shoulder: Secondary | ICD-10-CM | POA: Diagnosis not present

## 2022-04-14 DIAGNOSIS — M67814 Other specified disorders of tendon, left shoulder: Secondary | ICD-10-CM | POA: Diagnosis not present

## 2022-05-03 ENCOUNTER — Ambulatory Visit: Payer: BC Managed Care – PPO | Admitting: Dermatology

## 2022-05-03 DIAGNOSIS — D492 Neoplasm of unspecified behavior of bone, soft tissue, and skin: Secondary | ICD-10-CM

## 2022-05-03 DIAGNOSIS — D485 Neoplasm of uncertain behavior of skin: Secondary | ICD-10-CM

## 2022-05-03 DIAGNOSIS — L821 Other seborrheic keratosis: Secondary | ICD-10-CM

## 2022-05-03 DIAGNOSIS — D1721 Benign lipomatous neoplasm of skin and subcutaneous tissue of right arm: Secondary | ICD-10-CM

## 2022-05-03 DIAGNOSIS — Z1283 Encounter for screening for malignant neoplasm of skin: Secondary | ICD-10-CM

## 2022-05-03 DIAGNOSIS — D2221 Melanocytic nevi of right ear and external auricular canal: Secondary | ICD-10-CM | POA: Diagnosis not present

## 2022-05-03 DIAGNOSIS — L814 Other melanin hyperpigmentation: Secondary | ICD-10-CM

## 2022-05-03 DIAGNOSIS — D229 Melanocytic nevi, unspecified: Secondary | ICD-10-CM

## 2022-05-03 DIAGNOSIS — L578 Other skin changes due to chronic exposure to nonionizing radiation: Secondary | ICD-10-CM

## 2022-05-03 DIAGNOSIS — D1801 Hemangioma of skin and subcutaneous tissue: Secondary | ICD-10-CM

## 2022-05-03 NOTE — Patient Instructions (Signed)
Due to recent changes in healthcare laws, you may see results of your pathology and/or laboratory studies on MyChart before the doctors have had a chance to review them. We understand that in some cases there may be results that are confusing or concerning to you. Please understand that not all results are received at the same time and often the doctors may need to interpret multiple results in order to provide you with the best plan of care or course of treatment. Therefore, we ask that you please give us 2 business days to thoroughly review all your results before contacting the office for clarification. Should we see a critical lab result, you will be contacted sooner.   If You Need Anything After Your Visit  If you have any questions or concerns for your doctor, please call our main line at [phone] and press option 4 to reach your doctor's medical assistant. If no one answers, please leave a voicemail as directed and we will return your call as soon as possible. Messages left after 4 pm will be answered the following business day.   You may also send us a message via MyChart. We typically respond to MyChart messages within 1-2 business days.  For prescription refills, please ask your pharmacy to contact our office. Our fax number is [phone].  If you have an urgent issue when the clinic is closed that cannot wait until the next business day, you can page your doctor at the number below.    Please note that while we do our best to be available for urgent issues outside of office hours, we are not available 24/7.   If you have an urgent issue and are unable to reach us, you may choose to seek medical care at your doctor's office, retail clinic, urgent care center, or emergency room.  If you have a medical emergency, please immediately call 911 or go to the emergency department.  Pager Numbers  - Dr. Kowalski: [phone]  - Dr. Moye: [phone]  - Dr. Stewart:  [phone]  In the event of inclement weather, please call our main line at [phone] for an update on the status of any delays or closures.  Dermatology Medication Tips: Please keep the boxes that topical medications come in in order to help keep track of the instructions about where and how to use these. Pharmacies typically print the medication instructions only on the boxes and not directly on the medication tubes.   If your medication is too expensive, please contact our office at [phone] option 4 or send us a message through MyChart.   We are unable to tell what your co-pay for medications will be in advance as this is different depending on your insurance coverage. However, we may be able to find a substitute medication at lower cost or fill out paperwork to get insurance to cover a needed medication.   If a prior authorization is required to get your medication covered by your insurance company, please allow us 1-2 business days to complete this process.  Drug prices often vary depending on where the prescription is filled and some pharmacies may offer cheaper prices.  The website www.goodrx.com contains coupons for medications through different pharmacies. The prices here do not account for what the cost may be with help from insurance (it may be cheaper with your insurance), but the website can give you the price if you did not use any insurance.  - You can print the associated coupon and take it with   your prescription to the pharmacy.  - You may also stop by our office during regular business hours and pick up a GoodRx coupon card.  - If you need your prescription sent electronically to a different pharmacy, notify our office through [facility] MyChart or by phone at [phone] option 4.     Si Usted Necesita Algo Despus de Su Visita  Tambin puede enviarnos un mensaje a travs de MyChart. Por lo general respondemos a los mensajes de MyChart en el transcurso de 1 a 2  das hbiles.  Para renovar recetas, por favor pida a su farmacia que se ponga en contacto con nuestra oficina. Nuestro nmero de fax es el [phone].  Si tiene un asunto urgente cuando la clnica est cerrada y que no puede esperar hasta el siguiente da hbil, puede llamar/localizar a su doctor(a) al nmero que aparece a continuacin.   Por favor, tenga en cuenta que aunque hacemos todo lo posible para estar disponibles para asuntos urgentes fuera del horario de oficina, no estamos disponibles las 24 horas del da, los 7 das de la semana.   Si tiene un problema urgente y no puede comunicarse con nosotros, puede optar por buscar atencin mdica  en el consultorio de su doctor(a), en una clnica privada, en un centro de atencin urgente o en una sala de emergencias.  Si tiene una emergencia mdica, por favor llame inmediatamente al 911 o vaya a la sala de emergencias.  Nmeros de bper  - Dr. Kowalski: [phone]  - Dra. Moye: [phone]  - Dra. Stewart: [phone]  En caso de inclemencias del tiempo, por favor llame a nuestra lnea principal al [phone] para una actualizacin sobre el estado de cualquier retraso o cierre.  Consejos para la medicacin en dermatologa: Por favor, guarde las cajas en las que vienen los medicamentos de uso tpico para ayudarle a seguir las instrucciones sobre dnde y cmo usarlos. Las farmacias generalmente imprimen las instrucciones del medicamento slo en las cajas y no directamente en los tubos del medicamento.   Si su medicamento es muy caro, por favor, pngase en contacto con nuestra oficina llamando al [phone] y presione la opcin 4 o envenos un mensaje a travs de MyChart.   No podemos decirle cul ser su copago por los medicamentos por adelantado ya que esto es diferente dependiendo de la cobertura de su seguro. Sin embargo, es posible que podamos encontrar un medicamento sustituto a menor costo o llenar un formulario para que el  seguro cubra el medicamento que se considera necesario.   Si se requiere una autorizacin previa para que su compaa de seguros cubra su medicamento, por favor permtanos de 1 a 2 das hbiles para completar este proceso.  Los precios de los medicamentos varan con frecuencia dependiendo del lugar de dnde se surte la receta y alguna farmacias pueden ofrecer precios ms baratos.  El sitio web www.goodrx.com tiene cupones para medicamentos de diferentes farmacias. Los precios aqu no tienen en cuenta lo que podra costar con la ayuda del seguro (puede ser ms barato con su seguro), pero el sitio web puede darle el precio si no utiliz ningn seguro.  - Puede imprimir el cupn correspondiente y llevarlo con su receta a la farmacia.  - Tambin puede pasar por nuestra oficina durante el horario de atencin regular y recoger una tarjeta de cupones de GoodRx.  - Si necesita que su receta se enve electrnicamente a una farmacia diferente, informe a nuestra oficina a travs de MyChart de [facility]   o por telfono llamando al [phone] y presione la opcin 4.  

## 2022-05-03 NOTE — Progress Notes (Signed)
New Patient Visit  Subjective  Julia Nolan is a 41 y.o. female who presents for the following: Annual Exam (Mole check). Patient report her  PCP recommend she come here for a mole check. The patient presents for Total-Body Skin Exam (TBSE) for skin cancer screening and mole check.  The patient has spots, moles and lesions to be evaluated, some may be new or changing and the patient has concerns that these could be cancer.   The following portions of the chart were reviewed this encounter and updated as appropriate:   Tobacco  Allergies  Meds  Problems  Med Hx  Surg Hx  Fam Hx     Review of Systems:  No other skin or systemic complaints except as noted in HPI or Assessment and Plan.  Objective  Well appearing patient in no apparent distress; mood and affect are within normal limits.  A full examination was performed including scalp, head, eyes, ears, nose, lips, neck, chest, axillae, abdomen, back, buttocks, bilateral upper extremities, bilateral lower extremities, hands, feet, fingers, toes, fingernails, and toenails. All findings within normal limits unless otherwise noted below.  right ear superior helix 0.2 cm brown macule      Left Forearm - Anterior Multiple Subcutaneous nodule    left inferior check 0.5 cm medium brown macule       Right pretibia 0.6 cm medium brown macule       Assessment & Plan  Nevus right ear superior helix See photo Benign-appearing.  Observation.  Call clinic for new or changing lesions.  Recommend daily use of broad spectrum spf 30+ sunscreen to sun-exposed areas.    Lipoma of right upper extremity Left Forearm - Anterior Benign-appearing. Exam most consistent with an lipoma.  Discussed that a lipoma is a benign growth that can grow over time and sometimes get irritated or inflamed. Recommend observation if it is not bothersome. Discussed option of surgical excision to remove it if it is growing, symptomatic, or other changes  noted. Please call for new or changing lesions so they can be evaluated.   Neoplasm of skin (2) Nevus vs seborrheic keratosis left inferior cheek; Right pretibia See photos Benign-appearing.  Observation.  Call clinic for new or changing moles.  Recommend daily use of broad spectrum spf 30+ sunscreen to sun-exposed areas.    Lentigines - Scattered tan macules - Due to sun exposure - Benign-appearing, observe - Recommend daily broad spectrum sunscreen SPF 30+ to sun-exposed areas, reapply every 2 hours as needed. - Call for any changes  Seborrheic Keratoses - Stuck-on, waxy, tan-brown papules and/or plaques  - Benign-appearing - Discussed benign etiology and prognosis. - Observe - Call for any changes  Melanocytic Nevi - Tan-brown and/or pink-flesh-colored symmetric macules and papules - Benign appearing on exam today - Observation - Call clinic for new or changing moles - Recommend daily use of broad spectrum spf 30+ sunscreen to sun-exposed areas.   Hemangiomas - Red papules - Discussed benign nature - Observe - Call for any changes  Actinic Damage - chronic, secondary to cumulative UV radiation exposure/sun exposure over time - diffuse scaly erythematous macules with underlying dyspigmentation - Recommend daily broad spectrum sunscreen SPF 30+ to sun-exposed areas, reapply every 2 hours as needed.  - Recommend staying in the shade or wearing long sleeves, sun glasses (UVA+UVB protection) and wide brim hats (4-inch brim around the entire circumference of the hat). - Call for new or changing lesions.  Skin cancer screening performed today.   Return  in about 1 year (around 05/04/2023) for TBSE.  IMarye Round, CMA, am acting as scribe for Sarina Ser, MD .  Documentation: I have reviewed the above documentation for accuracy and completeness, and I agree with the above.  Sarina Ser, MD

## 2022-05-09 DIAGNOSIS — M25512 Pain in left shoulder: Secondary | ICD-10-CM | POA: Diagnosis not present

## 2022-05-10 ENCOUNTER — Encounter: Payer: Self-pay | Admitting: Dermatology

## 2022-05-19 ENCOUNTER — Ambulatory Visit
Admission: EM | Admit: 2022-05-19 | Discharge: 2022-05-19 | Disposition: A | Discharge status: Home / Self Care | Payer: BC Managed Care – PPO | Attending: Emergency Medicine | Admitting: Emergency Medicine

## 2022-05-19 DIAGNOSIS — Z1152 Encounter for screening for COVID-19: Secondary | ICD-10-CM | POA: Insufficient documentation

## 2022-05-19 DIAGNOSIS — J0100 Acute maxillary sinusitis, unspecified: Secondary | ICD-10-CM | POA: Diagnosis not present

## 2022-05-19 DIAGNOSIS — R051 Acute cough: Secondary | ICD-10-CM

## 2022-05-19 DIAGNOSIS — R059 Cough, unspecified: Secondary | ICD-10-CM | POA: Insufficient documentation

## 2022-05-19 MED ORDER — AMOXICILLIN 875 MG PO TABS: 875.0000 mg | ORAL_TABLET | Freq: Two times a day (BID) | ORAL | 0 refills | Status: AC

## 2022-05-19 NOTE — ED Triage Notes (Signed)
Patient to Urgent Care with complaints of nasal congestion and cough x5 days. Also reports a pounding headache. Denies any recent fevers. Cough productive at times.    Reports she has been taking otc cold meds with little relief.   Negative at home covid tests.

## 2022-05-19 NOTE — ED Provider Notes (Signed)
Roderic Palau    CSN: [id number] Arrival date & time: 05/19/22  1117      History   Chief Complaint Chief Complaint  Patient presents with   Nasal Congestion   Cough    HPI Julia Nolan is a 41 y.o. female.  Patient presents with 1 week history of sinus congestion, sinus pressure, postnasal drip, runny nose, headache, cough.  Multiple OTC treatments attempted without relief.  No fever, chills, rash, chest pain, shortness of breath, vomiting, diarrhea, or other symptoms.  The history is provided by the patient and medical records.    Past Medical History:  Diagnosis Date   Anxiety    Depression    Factor 5 Leiden mutation, heterozygous [facility])     Patient Active Problem List   Diagnosis Date Noted   Cough 05/19/2022   Irregular menses 07/15/2019   Routine physical examination 02/04/2019   Factor V Leiden mutation ([place]) 08/08/2018   Depression, major, single episode, mild ([place]) 08/08/2018    Past Surgical History:  Procedure Laterality Date   INTRAUTERINE DEVICE (IUD) INSERTION     NO PAST SURGERIES      OB History     Gravida  3   Para  3   Term  3   Preterm  0   AB  0   Living         SAB  0   IAB  0   Ectopic  0   Multiple      Live Births               Home Medications    Prior to Admission medications   Medication Sig Start Date End Date Taking? Authorizing Provider  amoxicillin (AMOXIL) 875 MG tablet Take 1 tablet (875 mg total) by mouth 2 (two) times daily for 10 days. 05/19/22 05/29/22 Yes Sharion Balloon, NP  cholecalciferol (VITAMIN D3) 25 MCG (1000 UT) tablet Take 2,000 Units by mouth daily. Patient not taking: Reported on 11/08/2021    [provider]  Elagolix Sodium (ORILISSA) 150 MG TABS Take 150 mg by mouth daily. 11/03/21   [provider]  ondansetron (ZOFRAN-ODT) 4 MG disintegrating tablet TAKE 1 TABLET BY MOUTH EVERY 8 HOURS AS NEEDED FOR NAUSEA AND VOMITING 12/06/21   Burnard Hawthorne, FNP   Probiotic Product (PROBIOTIC-10 PO) Take by mouth. Patient not taking: Reported on 11/08/2021    [provider]  sertraline (ZOLOFT) 100 MG tablet TAKE 1 TABLET BY MOUTH EVERY DAY 11/24/21   Burnard Hawthorne, FNP    Family History Family History  Problem Relation Age of Onset   Depression Mother    Hyperlipidemia Mother    Endometriosis Mother    Hyperlipidemia Father    Depression Father    Prostate cancer Father    Depression Sister    Stroke Maternal Grandmother    Endometriosis Maternal Grandmother    Arthritis Maternal Grandfather    Diabetes Maternal Grandfather    Hyperlipidemia Paternal Grandmother    Hyperlipidemia Paternal Grandfather    Hypertension Paternal Grandfather    Kidney disease Paternal Grandfather    Colon cancer Neg Hx    Breast cancer Neg Hx     Social History Social History   Tobacco Use   Smoking status: Never   Smokeless tobacco: Never  Vaping Use   Vaping Use: Never used  Substance Use Topics   Alcohol use: Yes   Drug use: Never     Allergies  Patient has no known allergies.   Review of Systems Review of Systems  Constitutional:  Negative for chills and fever.  HENT:  Positive for congestion, postnasal drip, rhinorrhea and sinus pressure. Negative for ear pain and sore throat.   Respiratory:  Positive for cough. Negative for shortness of breath.   Cardiovascular:  Negative for chest pain and palpitations.  Gastrointestinal:  Negative for diarrhea and vomiting.  Skin:  Negative for rash.  All other systems reviewed and are negative.    Physical Exam Triage Vital Signs ED Triage Vitals  Enc Vitals Group     BP 05/19/22 1138 130/86     Pulse Rate 05/19/22 1128 91     Resp 05/19/22 1128 18     Temp 05/19/22 1128 98.2 F (36.8 C)     Temp src --      SpO2 05/19/22 1128 97 %     Weight 05/19/22 1134 180 lb (81.6 kg)     Height 05/19/22 1134 '5\' 9"'$  (1.753 m)     Head Circumference --      Peak Flow --      Pain  Score 05/19/22 1134 8     Pain Loc --      Pain Edu? --      Excl. in [place]? --    No data found.  Updated Vital Signs BP 130/86   Pulse 91   Temp 98.2 F (36.8 C)   Resp 18   Ht '5\' 9"'$  (1.753 m)   Wt 180 lb (81.6 kg)   LMP 05/09/2022   SpO2 97%   BMI 26.58 kg/m   Visual Acuity Right Eye Distance:   Left Eye Distance:   Bilateral Distance:    Right Eye Near:   Left Eye Near:    Bilateral Near:     Physical Exam Vitals and nursing note reviewed.  Constitutional:      General: She is not in acute distress.    Appearance: She is well-developed. She is not ill-appearing.  HENT:     Right Ear: Tympanic membrane normal.     Left Ear: Tympanic membrane normal.     Nose: Congestion and rhinorrhea present.     Mouth/Throat:     Mouth: Mucous membranes are moist.     Pharynx: Oropharynx is clear.  Cardiovascular:     Rate and Rhythm: Normal rate and regular rhythm.     Heart sounds: Normal heart sounds.  Pulmonary:     Effort: Pulmonary effort is normal. No respiratory distress.     Breath sounds: Normal breath sounds.  Musculoskeletal:     Cervical back: Neck supple.  Skin:    General: Skin is warm and dry.  Neurological:     Mental Status: She is alert.  Psychiatric:        Mood and Affect: Mood normal.        Behavior: Behavior normal.      UC Treatments / Results  Labs (all labs ordered are listed, but only abnormal results are displayed) Labs Reviewed  SARS CORONAVIRUS 2 (TAT 6-24 HRS)    EKG   Radiology No results found.  Procedures Procedures (including critical care time)  Medications Ordered in UC Medications - No data to display  Initial Impression / Assessment and Plan / UC Course  I have reviewed the triage vital signs and the nursing notes.  Pertinent labs & imaging results that were available during my care of the patient were reviewed by me and  considered in my medical decision making (see chart for details).   Cough, acute  sinusitis.  Patient has been symptomatic for 1 week and is not improving with OTC treatment.  Treating today with amoxicillin.  Discussed continued symptomatic care with ibuprofen and plain Mucinex.  Instructed patient to follow up with her PCP if her symptoms are not improving.  She agrees to plan of care.     Final Clinical Impressions(s) / UC Diagnoses   Final diagnoses:  Acute cough  Acute non-recurrent maxillary sinusitis     Discharge Instructions      Take the amoxicillin as directed.  Follow up with your primary care provider if your symptoms are not improving.        ED Prescriptions     Medication Sig Dispense Auth. Provider   amoxicillin (AMOXIL) 875 MG tablet Take 1 tablet (875 mg total) by mouth 2 (two) times daily for 10 days. 20 tablet Sharion Balloon, NP      PDMP not reviewed this encounter.   Sharion Balloon, NP 05/19/22 [phone]

## 2022-05-19 NOTE — Discharge Instructions (Addendum)
Take the amoxicillin as directed.  Follow up with your primary care provider if your symptoms are not improving.   ° ° °

## 2022-05-20 LAB — SARS CORONAVIRUS 2 (TAT 6-24 HRS): SARS Coronavirus 2: NEGATIVE

## 2022-05-23 DIAGNOSIS — M25512 Pain in left shoulder: Secondary | ICD-10-CM | POA: Diagnosis not present

## 2022-07-12 ENCOUNTER — Ambulatory Visit: Payer: BC Managed Care – PPO | Admitting: Family

## 2022-12-12 NOTE — Progress Notes (Unsigned)
Allegra Grana, FNP   No chief complaint on file.   HPI:      Julia Nolan is a 42 y.o. G3P3000 whose LMP was No LMP recorded., presents today for ***  Hx of CPP, managed by Dr. Bonney Aid  Patient Active Problem List   Diagnosis Date Noted   Cough 05/19/2022   Irregular menses 07/15/2019   Routine physical examination 02/04/2019   Factor V Leiden mutation (H[facility]) 08/08/2018   Depression, major, single episode, mild (H[facility]) 08/08/2018    Past Surgical History:  Procedure Laterality Date   INTRAUTERINE DEVICE (IUD) INSERTION     NO PAST SURGERIES      Family History  Problem Relation Age of Onset   Depression Mother    Hyperlipidemia Mother    Endometriosis Mother    Hyperlipidemia Father    Depression Father    Prostate cancer Father    Depression Sister    Stroke Maternal Grandmother    Endometriosis Maternal Grandmother    Arthritis Maternal Grandfather    Diabetes Maternal Grandfather    Hyperlipidemia Paternal Grandmother    Hyperlipidemia Paternal Grandfather    Hypertension Paternal Grandfather    Kidney disease Paternal Grandfather    Colon cancer Neg Hx    Breast cancer Neg Hx     Social History   Socioeconomic History   Marital status: Married    Spouse name: Blyss Pogue   Number of children: 3   Years of education: Not on file   Highest education level: Bachelor's degree (e.g., BA, AB, BS)  Occupational History   Not on file  Tobacco Use   Smoking status: Never   Smokeless tobacco: Never  Vaping Use   Vaping Use: Never used  Substance and Sexual Activity   Alcohol use: Yes   Drug use: Never   Sexual activity: Yes    Birth control/protection: None  Other Topics Concern   Not on file  Social History Narrative   ** Merged History Encounter **       Moved here from [place].   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    Social Determinants of Health   Financial Resource Strain: Not on file  Food Insecurity: Not on file   Transportation Needs: Not on file  Physical Activity: Not on file  Stress: Not on file  Social Connections: Not on file  Intimate Partner Violence: Not on file    Outpatient Medications Prior to Visit  Medication Sig Dispense Refill   cholecalciferol (VITAMIN D3) 25 MCG (1000 UT) tablet Take 2,000 Units by mouth daily. (Patient not taking: Reported on 11/08/2021)     Elagolix Sodium (ORILISSA) 150 MG TABS Take 150 mg by mouth daily.     ondansetron (ZOFRAN-ODT) 4 MG disintegrating tablet TAKE 1 TABLET BY MOUTH EVERY 8 HOURS AS NEEDED FOR NAUSEA AND VOMITING 30 tablet 1   Probiotic Product (PROBIOTIC-10 PO) Take by mouth. (Patient not taking: Reported on 11/08/2021)     sertraline (ZOLOFT) 100 MG tablet TAKE 1 TABLET BY MOUTH EVERY DAY 90 tablet 5   No facility-administered medications prior to visit.      ROS:  Review of Systems BREAST: No symptoms   OBJECTIVE:   Vitals:  There were no vitals taken for this visit.  Physical Exam  Results: No results found for this or any previous visit (from the past 24 hour(s)).   Assessment/Plan: No diagnosis found.    No orders of the  defined types were placed in this encounter.     No follow-ups on file.  Eulala Newcombe B. Tiwatope Emmitt, PA-C 12/12/2022 4:54 PM

## 2022-12-13 ENCOUNTER — Encounter: Payer: Self-pay | Admitting: Obstetrics and Gynecology

## 2022-12-13 ENCOUNTER — Ambulatory Visit: Payer: BC Managed Care – PPO | Admitting: Obstetrics and Gynecology

## 2022-12-13 VITALS — BP 112/80 | Ht 69.0 in | Wt 196.0 lb

## 2022-12-13 DIAGNOSIS — N9089 Other specified noninflammatory disorders of vulva and perineum: Secondary | ICD-10-CM

## 2022-12-27 ENCOUNTER — Other Ambulatory Visit (HOSPITAL_COMMUNITY)
Admission: RE | Admit: 2022-12-27 | Discharge: 2022-12-27 | Disposition: A | Discharge status: Home / Self Care | Payer: BC Managed Care – PPO | Source: Ambulatory Visit | Attending: Obstetrics and Gynecology | Admitting: Obstetrics and Gynecology

## 2022-12-27 ENCOUNTER — Ambulatory Visit (INDEPENDENT_AMBULATORY_CARE_PROVIDER_SITE_OTHER): Payer: BC Managed Care – PPO | Admitting: Obstetrics and Gynecology

## 2022-12-27 ENCOUNTER — Encounter: Payer: Self-pay | Admitting: Obstetrics and Gynecology

## 2022-12-27 ENCOUNTER — Other Ambulatory Visit: Payer: Self-pay | Admitting: Family

## 2022-12-27 VITALS — BP 129/88 | HR 73 | Ht 69.0 in | Wt 198.0 lb

## 2022-12-27 DIAGNOSIS — N9089 Other specified noninflammatory disorders of vulva and perineum: Secondary | ICD-10-CM

## 2022-12-27 DIAGNOSIS — Z1231 Encounter for screening mammogram for malignant neoplasm of breast: Secondary | ICD-10-CM

## 2022-12-27 DIAGNOSIS — D1800 Hemangioma unspecified site: Secondary | ICD-10-CM | POA: Diagnosis not present

## 2022-12-27 NOTE — Progress Notes (Signed)
Patient presents today for her labial excision. She states no additional questions or concerns.

## 2022-12-27 NOTE — Progress Notes (Signed)
HPI:      Ms. Chanita Hubby is a 42 y.o. G3P3000 who LMP was Patient's last menstrual period was 12/08/2022 (exact date).  Subjective:   She presents today for removal of left labial lesion.    Hx: The following portions of the patient's history were reviewed and updated as appropriate:             She  has a past medical history of Anxiety, Depression, and Factor 5 Leiden mutation, heterozygous (HCC). She does not have any pertinent problems on file. She  has a past surgical history that includes Intrauterine device (iud) insertion. Her family history includes Arthritis in her maternal grandfather; Depression in her father, mother, and sister; Diabetes in her maternal grandfather; Endometriosis in her maternal grandmother and mother; Hyperlipidemia in her father, mother, paternal grandfather, and paternal grandmother; Hypertension in her paternal grandfather; Kidney disease in her paternal grandfather; Prostate cancer in her father; Stroke in her maternal grandmother. She  reports that she has never smoked. She has never used smokeless tobacco. She reports current alcohol use. She reports that she does not use drugs. She has a current medication list which includes the following prescription(s): orilissa, ondansetron, and sertraline. She has No Known Allergies.       Review of Systems:  Review of Systems  Constitutional: Denied constitutional symptoms, night sweats, recent illness, fatigue, fever, insomnia and weight loss.  Eyes: Denied eye symptoms, eye pain, photophobia, vision change and visual disturbance.  Ears/Nose/Throat/Neck: Denied ear, nose, throat or neck symptoms, hearing loss, nasal discharge, sinus congestion and sore throat.  Cardiovascular: Denied cardiovascular symptoms, arrhythmia, chest pain/pressure, edema, exercise intolerance, orthopnea and palpitations.  Respiratory: Denied pulmonary symptoms, asthma, pleuritic pain, productive sputum, cough, dyspnea and wheezing.   Gastrointestinal: Denied, gastro-esophageal reflux, melena, nausea and vomiting.  Genitourinary: Denied genitourinary symptoms including symptomatic vaginal discharge, pelvic relaxation issues, and urinary complaints.  Musculoskeletal: Denied musculoskeletal symptoms, stiffness, swelling, muscle weakness and myalgia.  Dermatologic: Denied dermatology symptoms, rash and scar.  Neurologic: Denied neurology symptoms, dizziness, headache, neck pain and syncope.  Psychiatric: Denied psychiatric symptoms, anxiety and depression.  Endocrine: Denied endocrine symptoms including hot flashes and night sweats.   Meds:   Current Outpatient Medications on File Prior to Visit  Medication Sig Dispense Refill   Elagolix Sodium (ORILISSA) 150 MG TABS Take 150 mg by mouth daily.     ondansetron (ZOFRAN-ODT) 4 MG disintegrating tablet TAKE 1 TABLET BY MOUTH EVERY 8 HOURS AS NEEDED FOR NAUSEA AND VOMITING 30 tablet 1   sertraline (ZOLOFT) 100 MG tablet TAKE 1 TABLET BY MOUTH EVERY DAY 90 tablet 5   No current facility-administered medications on file prior to visit.      Objective:     Vitals:   12/27/22 0943  BP: 129/88  Pulse: 73   Filed Weights   12/27/22 0943  Weight: 198 lb (89.8 kg)              After discussing the risks and benefits of removal of the lesion and its likely benign nature, the patient has decided to go forth with removal in the office.  Prior to and after removal patient agreed to photographic images being attached to her chart.   Procedure:   Patient was placed in the dorsolithotomy position and the base of the growth was cleaned with Betadine.  Local injection of the base was performed using lidocaine with epinephrine.  Total of 2 cc was used. The base of the lesion was crossclamped  using a hemostat.  2 suture ties were placed around the base and the hemostat was removed.  Using 50 W of coagulation current the lesion was removed using the Bovie.  No bleeding occurred.  A  figure-of-eight suture was used to further constrict and approximate the base of the lesion.  Dermabond was used as a dressing.  The growth was sent to pathology for further evaluation.          Assessment:    G3P3000 Patient Active Problem List   Diagnosis Date Noted   Cough 05/19/2022   Irregular menses 07/15/2019   Routine physical examination 02/04/2019   Factor V Leiden mutation ([facility]) 08/08/2018   Depression, major, single episode, mild ([facility]) 08/08/2018     1. Labial lesion        Plan:            1.  Patient to refrain from tub baths and the pool for 5 days.  2.  May shower Orders No orders of the defined types were placed in this encounter.   No orders of the defined types were placed in this encounter.     F/U  Return in about 4 weeks (around 01/24/2023).  Elonda Husky, M.D. 12/27/2022 10:35 AM

## 2022-12-29 ENCOUNTER — Telehealth: Payer: Self-pay | Admitting: Obstetrics and Gynecology

## 2022-12-29 ENCOUNTER — Ambulatory Visit
Admission: RE | Admit: 2022-12-29 | Discharge: 2022-12-29 | Disposition: A | Discharge status: Home / Self Care | Payer: BC Managed Care – PPO | Source: Ambulatory Visit

## 2022-12-29 DIAGNOSIS — Z1231 Encounter for screening mammogram for malignant neoplasm of breast: Secondary | ICD-10-CM

## 2022-12-29 LAB — SURGICAL PATHOLOGY

## 2022-12-29 NOTE — Telephone Encounter (Signed)
Called patient this morning offering a follow up appointment with Dr. Logan Bores on 01/24/2023 at 1:35 pm.  Asked the patient to call to confirm the date and time.

## 2023-01-24 ENCOUNTER — Ambulatory Visit: Payer: BC Managed Care – PPO | Admitting: Obstetrics and Gynecology

## 2023-01-24 ENCOUNTER — Encounter: Payer: Self-pay | Admitting: Obstetrics and Gynecology

## 2023-01-24 VITALS — BP 119/81 | HR 66 | Ht 69.0 in | Wt 201.8 lb

## 2023-01-24 DIAGNOSIS — N9089 Other specified noninflammatory disorders of vulva and perineum: Secondary | ICD-10-CM | POA: Diagnosis not present

## 2023-01-24 NOTE — Progress Notes (Signed)
HPI:      [name] is a 42 y.o. G3P3000 who LMP was Patient's last menstrual period was 01/08/2023.  Subjective:   She presents today for follow-up of labial lesion.  She reports that it is completely healed.  She did not have any further bleeding.  She has had intercourse without issue.    Hx: The following portions of the patient's history were reviewed and updated as appropriate:             She  has a past medical history of Anxiety, Depression, and Factor 5 Leiden mutation, heterozygous (HCC). She does not have any pertinent problems on file. She  has a past surgical history that includes Intrauterine device (iud) insertion. Her family history includes Arthritis in her maternal grandfather; Depression in her father, mother, and sister; Diabetes in her maternal grandfather; Endometriosis in her maternal grandmother and mother; Hyperlipidemia in her father, mother, paternal grandfather, and paternal grandmother; Hypertension in her paternal grandfather; Kidney disease in her paternal grandfather; Prostate cancer in her father; Stroke in her maternal grandmother. She  reports that she has never smoked. She has never used smokeless tobacco. She reports current alcohol use. She reports that she does not use drugs. She has a current medication list which includes the following prescription(s): orilissa, ondansetron, and sertraline. She has No Known Allergies.       Review of Systems:  Review of Systems  Constitutional: Denied constitutional symptoms, night sweats, recent illness, fatigue, fever, insomnia and weight loss.  Eyes: Denied eye symptoms, eye pain, photophobia, vision change and visual disturbance.  Ears/Nose/Throat/Neck: Denied ear, nose, throat or neck symptoms, hearing loss, nasal discharge, sinus congestion and sore throat.  Cardiovascular: Denied cardiovascular symptoms, arrhythmia, chest pain/pressure, edema, exercise intolerance, orthopnea and palpitations.   Respiratory: Denied pulmonary symptoms, asthma, pleuritic pain, productive sputum, cough, dyspnea and wheezing.  Gastrointestinal: Denied, gastro-esophageal reflux, melena, nausea and vomiting.  Genitourinary: Denied genitourinary symptoms including symptomatic vaginal discharge, pelvic relaxation issues, and urinary complaints.  Musculoskeletal: Denied musculoskeletal symptoms, stiffness, swelling, muscle weakness and myalgia.  Dermatologic: Denied dermatology symptoms, rash and scar.  Neurologic: Denied neurology symptoms, dizziness, headache, neck pain and syncope.  Psychiatric: Denied psychiatric symptoms, anxiety and depression.  Endocrine: Denied endocrine symptoms including hot flashes and night sweats.   Meds:   Current Outpatient Medications on File Prior to Visit  Medication Sig Dispense Refill   Elagolix Sodium (ORILISSA) 150 MG TABS Take 150 mg by mouth daily.     ondansetron (ZOFRAN-ODT) 4 MG disintegrating tablet TAKE 1 TABLET BY MOUTH EVERY 8 HOURS AS NEEDED FOR NAUSEA AND VOMITING 30 tablet 1   sertraline (ZOLOFT) 100 MG tablet TAKE 1 TABLET BY MOUTH EVERY DAY 90 tablet 5   No current facility-administered medications on file prior to visit.      Objective:     Vitals:   01/24/23 1335  BP: 119/81  Pulse: 66   Filed Weights   01/24/23 1335  Weight: 201 lb 12.8 oz (91.5 kg)              Appears completely resolved on vulvar examination          Assessment:    G3P3000 Patient Active Problem List   Diagnosis Date Noted   Cough 05/19/2022   Irregular menses 07/15/2019   Routine physical examination 02/04/2019   Factor V Leiden mutation (HCC) 08/08/2018   Depression, major, single episode, mild (HCC) 08/08/2018     1. Labial lesion  Resolved-hemangioma pathology.   Plan:            1.  Nothing further to do-patient to return for annual examination whenever that is scheduled. Orders No orders of the defined types were placed in this  encounter.   No orders of the defined types were placed in this encounter.     F/U  Return for Annual Physical.  Elonda Husky, M.D. 01/24/2023 1:50 PM

## 2023-01-24 NOTE — Progress Notes (Signed)
Patient presents today to follow-up from a skin tag removal. She reports no problems or issues since procedure.

## 2023-01-25 ENCOUNTER — Ambulatory Visit: Payer: BC Managed Care – PPO | Admitting: Obstetrics and Gynecology

## 2023-02-14 NOTE — Telephone Encounter (Signed)
Pt saw Dr. Logan Bores on 01/24/2023 at 1:35.

## 2023-05-11 ENCOUNTER — Encounter: Payer: BC Managed Care – PPO | Admitting: Dermatology

## 2023-07-27 DIAGNOSIS — D225 Melanocytic nevi of trunk: Secondary | ICD-10-CM | POA: Diagnosis not present

## 2023-07-27 DIAGNOSIS — D485 Neoplasm of uncertain behavior of skin: Secondary | ICD-10-CM | POA: Diagnosis not present

## 2023-07-27 DIAGNOSIS — D2272 Melanocytic nevi of left lower limb, including hip: Secondary | ICD-10-CM | POA: Diagnosis not present

## 2023-07-27 DIAGNOSIS — D2262 Melanocytic nevi of left upper limb, including shoulder: Secondary | ICD-10-CM | POA: Diagnosis not present

## 2023-07-27 DIAGNOSIS — R208 Other disturbances of skin sensation: Secondary | ICD-10-CM | POA: Diagnosis not present

## 2023-07-27 DIAGNOSIS — D2261 Melanocytic nevi of right upper limb, including shoulder: Secondary | ICD-10-CM | POA: Diagnosis not present

## 2023-09-06 ENCOUNTER — Ambulatory Visit: Payer: BC Managed Care – PPO | Admitting: Family

## 2023-09-06 ENCOUNTER — Encounter: Payer: Self-pay | Admitting: Family

## 2023-09-06 VITALS — BP 130/88 | HR 72 | Temp 97.8°F | Ht 69.0 in | Wt 193.4 lb

## 2023-09-06 DIAGNOSIS — N809 Endometriosis, unspecified: Secondary | ICD-10-CM | POA: Diagnosis not present

## 2023-09-06 DIAGNOSIS — Z13220 Encounter for screening for lipoid disorders: Secondary | ICD-10-CM | POA: Diagnosis not present

## 2023-09-06 DIAGNOSIS — N926 Irregular menstruation, unspecified: Secondary | ICD-10-CM | POA: Diagnosis not present

## 2023-09-06 DIAGNOSIS — Z79890 Hormone replacement therapy (postmenopausal): Secondary | ICD-10-CM | POA: Insufficient documentation

## 2023-09-06 DIAGNOSIS — F411 Generalized anxiety disorder: Secondary | ICD-10-CM | POA: Diagnosis not present

## 2023-09-06 DIAGNOSIS — Z136 Encounter for screening for cardiovascular disorders: Secondary | ICD-10-CM

## 2023-09-06 MED ORDER — SERTRALINE HCL 50 MG PO TABS: 50.0000 mg | ORAL_TABLET | Freq: Every day | ORAL | 3 refills | Status: DC

## 2023-09-06 MED ORDER — ONDANSETRON 4 MG PO TBDP: ORAL_TABLET | ORAL | 1 refills | Status: AC

## 2023-09-06 MED ORDER — HYDROXYZINE HCL 10 MG PO TABS: 10.0000 mg | ORAL_TABLET | Freq: Two times a day (BID) | ORAL | 1 refills | Status: DC | PRN

## 2023-09-06 NOTE — Progress Notes (Signed)
Assessment & Plan:  GAD (generalized anxiety disorder) Assessment & Plan: Suboptimal control.  Resume zoloft 50mg  and titrate as needed.  Provided Atarax 10 mg to use as needed for anxiety.  She will continue follow with counselor.  Close follow up  Orders: -     VITAMIN D 25 Hydroxy (Vit-D Deficiency, Fractures); Future -     Hemoglobin A1c; Future -     CBC with Differential/Platelet; Future -     Comprehensive metabolic panel; Future -     TSH; Future -     hydrOXYzine HCl; Take 1-2 tablets (10-20 mg total) by mouth 2 (two) times daily as needed for anxiety.  Dispense: 30 tablet; Refill: 1 -     Sertraline HCl; Take 1 tablet (50 mg total) by mouth daily.  Dispense: 90 tablet; Refill: 3  Endometriosis Assessment & Plan: Reviewed previous note from Dr. Bonney Nolan.  Refilled Zofran for as needed use.   Encounter for lipid screening for cardiovascular disease -     Lipid panel; Future  Irregular menses -     Ondansetron; TAKE 1 TABLET BY MOUTH EVERY 8 HOURS AS NEEDED FOR NAUSEA AND VOMITING  Dispense: 30 tablet; Refill: 1     Return precautions given.   Risks, benefits, and alternatives of the medications and treatment plan prescribed today were discussed, and patient expressed understanding.   Education regarding symptom management and diagnosis given to patient on AVS either electronically or printed.  Return for Complete Physical Exam, Fasting labs in 2-3 weeks.  Julia Plowman, FNP  Subjective:    Patient ID: Julia Nolan, female    DOB: October 20, 1980, 43 y.o.   MRN: [id number]  CC: Julia Nolan is a 43 y.o. female who presents today for follow up.   HPI: Complains of increased anxiety for past several months.  Denies depression .she had stopped Zoloft 100 mg on her own as wasn't so sure if she needed medication.  Increased stress around marriage, raising 3 boys.    She started HRT through online platform on Julia [facility]  She is taking estradiol patch and  progesterone  Menses are regular.  h/o endometriosis.   She would like refill of zofran prn for nausea with menses  She is regularly walking and working with counselor on relaxation techniques   Dr Julia Nolan  diagnostic laparoscopy 12/17/2021 for chronic pelvic pain, pathology consistent with endometriosis .   She would like CPE labs ordered  Allergies: Patient has no known allergies. Current Outpatient Medications on File Prior to Visit  Medication Sig Dispense Refill   estradiol (CLIMARA - DOSED IN MG/24 HR) 0.0375 mg/24hr patch Place 0.0375 mg onto the skin once a week.     progesterone (PROMETRIUM) 100 MG capsule Take 100 mg by mouth daily.     No current facility-administered medications on file prior to visit.    Review of Systems  Constitutional:  Negative for chills and fever.  Respiratory:  Negative for cough.   Cardiovascular:  Negative for chest pain and palpitations.  Gastrointestinal:  Negative for nausea and vomiting.  Psychiatric/Behavioral:  Positive for sleep disturbance. Negative for suicidal ideas. The patient is nervous/anxious.       Objective:    BP 130/88   Pulse 72   Temp 97.8 F (36.6 C) (Oral)   Ht 5\' 9"  (1.753 m)   Wt 193 lb 6.4 oz (87.7 kg)   LMP  (LMP Unknown)   SpO2 99%   BMI 28.56 kg/m  BP Readings from  Last 3 Encounters:  09/06/23 130/88  01/24/23 119/81  12/27/22 129/88   Wt Readings from Last 3 Encounters:  09/06/23 193 lb 6.4 oz (87.7 kg)  01/24/23 201 lb 12.8 oz (91.5 kg)  12/27/22 198 lb (89.8 kg)      09/06/2023    8:31 AM 11/08/2021   10:00 AM 10/30/2020   10:38 AM  Depression screen PHQ 2/9  Decreased Interest 1 0 0  Down, Depressed, Hopeless 1 0 0  PHQ - 2 Score 2 0 0  Altered sleeping 1 0 2  Tired, decreased energy 1 0 1  Change in appetite 1 0 0  Feeling bad or failure about yourself  1 0 0  Trouble concentrating 1 0 0  Moving slowly or fidgety/restless 0 0 0  Suicidal thoughts 0 0 0  PHQ-9 Score 7 0 3  Difficult  doing work/chores Somewhat difficult Not difficult at all Somewhat difficult     Physical Exam Vitals reviewed.  Constitutional:      Appearance: She is well-developed.  Eyes:     Conjunctiva/sclera: Conjunctivae normal.  Neck:     Thyroid: No thyroid mass or thyromegaly.  Cardiovascular:     Rate and Rhythm: Normal rate and regular rhythm.     Pulses: Normal pulses.     Heart sounds: Normal heart sounds.  Pulmonary:     Effort: Pulmonary effort is normal.     Breath sounds: Normal breath sounds. No wheezing, rhonchi or rales.  Lymphadenopathy:     Head:     Right side of head: No submental, submandibular, tonsillar, preauricular, posterior auricular or occipital adenopathy.     Left side of head: No submental, submandibular, tonsillar, preauricular, posterior auricular or occipital adenopathy.     Cervical: No cervical adenopathy.  Skin:    General: Skin is warm and dry.  Neurological:     Mental Status: She is alert.  Psychiatric:        Speech: Speech normal.        Behavior: Behavior normal.        Thought Content: Thought content normal.

## 2023-09-06 NOTE — Patient Instructions (Signed)
Resume Zoloft 50 mg. You may increase to 100 mg after couple weeks if needed.  Atarax provided to be used as needed for anxiety, or anxiety affecting sleep. Please let me know how you are doing

## 2023-09-06 NOTE — Assessment & Plan Note (Signed)
Reviewed previous note from Dr. Bonney Aid.  Refilled Zofran for as needed use.

## 2023-09-06 NOTE — Assessment & Plan Note (Addendum)
Suboptimal control.  Resume zoloft 50mg  and titrate as needed.  Provided Atarax 10 mg to use as needed for anxiety.  She will continue follow with counselor.  Close follow up

## 2023-09-13 ENCOUNTER — Other Ambulatory Visit: Payer: Self-pay | Admitting: Family

## 2023-09-13 DIAGNOSIS — F411 Generalized anxiety disorder: Secondary | ICD-10-CM

## 2023-09-26 ENCOUNTER — Other Ambulatory Visit (INDEPENDENT_AMBULATORY_CARE_PROVIDER_SITE_OTHER): Payer: BC Managed Care – PPO

## 2023-09-26 DIAGNOSIS — Z136 Encounter for screening for cardiovascular disorders: Secondary | ICD-10-CM | POA: Diagnosis not present

## 2023-09-26 DIAGNOSIS — F411 Generalized anxiety disorder: Secondary | ICD-10-CM

## 2023-09-26 DIAGNOSIS — Z13220 Encounter for screening for lipoid disorders: Secondary | ICD-10-CM

## 2023-09-26 LAB — COMPREHENSIVE METABOLIC PANEL
ALT: 32 U/L (ref 0–35)
AST: 24 U/L (ref 0–37)
Albumin: 4.4 g/dL (ref 3.5–5.2)
Alkaline Phosphatase: 91 U/L (ref 39–117)
BUN: 13 mg/dL (ref 6–23)
CO2: 27 meq/L (ref 19–32)
Calcium: 9.2 mg/dL (ref 8.4–10.5)
Chloride: 103 meq/L (ref 96–112)
Creatinine, Ser: 0.75 mg/dL (ref 0.40–1.20)
GFR: 97.66 mL/min (ref >60.00)
Glucose, Bld: 98 mg/dL (ref 70–99)
Potassium: 4.3 meq/L (ref 3.5–5.1)
Sodium: 137 meq/L (ref 135–145)
Total Bilirubin: 0.8 mg/dL (ref 0.2–1.2)
Total Protein: 7.4 g/dL (ref 6.0–8.3)

## 2023-09-26 LAB — CBC WITH DIFFERENTIAL/PLATELET
Basophils Absolute: 0.1 10*3/uL (ref 0.0–0.1)
Basophils Relative: 1.2 % (ref 0.0–3.0)
Eosinophils Absolute: 0.1 10*3/uL (ref 0.0–0.7)
Eosinophils Relative: 1.3 % (ref 0.0–5.0)
HCT: 44.3 % (ref 36.0–46.0)
Hemoglobin: 15 g/dL (ref 12.0–15.0)
Lymphocytes Relative: 29.1 % (ref 12.0–46.0)
Lymphs Abs: 1.7 10*3/uL (ref 0.7–4.0)
MCHC: 33.8 g/dL (ref 30.0–36.0)
MCV: 89.2 fl (ref 78.0–100.0)
Monocytes Absolute: 0.4 10*3/uL (ref 0.1–1.0)
Monocytes Relative: 7.7 % (ref 3.0–12.0)
Neutro Abs: 3.4 10*3/uL (ref 1.4–7.7)
Neutrophils Relative %: 60.7 % (ref 43.0–77.0)
Platelets: 338 10*3/uL (ref 150.0–400.0)
RBC: 4.96 Mil/uL (ref 3.87–5.11)
RDW: 13.1 % (ref 11.5–15.5)
WBC: 5.7 10*3/uL (ref 4.0–10.5)

## 2023-09-26 LAB — LIPID PANEL
Cholesterol: 199 mg/dL (ref 0–200)
HDL: 45.9 mg/dL (ref >39.00)
LDL Cholesterol: 128 mg/dL — ABNORMAL HIGH (ref 0–99)
NonHDL: 153.33
Total CHOL/HDL Ratio: 4
Triglycerides: 125 mg/dL (ref 0.0–149.0)
VLDL: 25 mg/dL (ref 0.0–40.0)

## 2023-09-26 LAB — VITAMIN D 25 HYDROXY (VIT D DEFICIENCY, FRACTURES): VITD: 20.93 ng/mL — ABNORMAL LOW (ref 30.00–100.00)

## 2023-09-26 LAB — HEMOGLOBIN A1C: Hgb A1c MFr Bld: 5.6 % (ref 4.6–6.5)

## 2023-09-26 LAB — TSH: TSH: 1.44 u[IU]/mL (ref 0.35–5.50)

## 2023-09-27 ENCOUNTER — Encounter: Payer: Self-pay | Admitting: Family

## 2023-09-28 ENCOUNTER — Other Ambulatory Visit: Payer: Self-pay | Admitting: Family

## 2023-09-28 DIAGNOSIS — F411 Generalized anxiety disorder: Secondary | ICD-10-CM

## 2023-10-03 ENCOUNTER — Other Ambulatory Visit (HOSPITAL_COMMUNITY)
Admission: RE | Admit: 2023-10-03 | Discharge: 2023-10-03 | Disposition: A | Discharge status: Home / Self Care | Source: Ambulatory Visit | Attending: Family | Admitting: Family

## 2023-10-03 ENCOUNTER — Encounter: Payer: Self-pay | Admitting: Family

## 2023-10-03 ENCOUNTER — Ambulatory Visit (INDEPENDENT_AMBULATORY_CARE_PROVIDER_SITE_OTHER): Payer: BC Managed Care – PPO | Admitting: Family

## 2023-10-03 VITALS — BP 130/78 | HR 57 | Temp 98.3°F | Ht 70.0 in | Wt 190.2 lb

## 2023-10-03 DIAGNOSIS — F411 Generalized anxiety disorder: Secondary | ICD-10-CM | POA: Diagnosis not present

## 2023-10-03 DIAGNOSIS — Z23 Encounter for immunization: Secondary | ICD-10-CM | POA: Diagnosis not present

## 2023-10-03 DIAGNOSIS — Z124 Encounter for screening for malignant neoplasm of cervix: Secondary | ICD-10-CM

## 2023-10-03 DIAGNOSIS — Z0000 Encounter for general adult medical examination without abnormal findings: Secondary | ICD-10-CM | POA: Diagnosis not present

## 2023-10-03 NOTE — Progress Notes (Signed)
 Assessment & Plan:  Screening for cervical cancer -     Cytology - PAP  Need for diphtheria-tetanus-pertussis (Tdap) vaccine -     Tdap vaccine greater than or equal to 44yo IM  GAD (generalized anxiety disorder) Assessment & Plan: Chronic, stable.  Continue Zoloft 50 mg daily, Atarax 10 mg as needed   Routine physical examination Assessment & Plan: Clinical breast exam performed.  Pap smear obtained.  Congratulated patient on diligence to exercise.  Tdap given      Return precautions given.   Risks, benefits, and alternatives of the medications and treatment plan prescribed today were discussed, and patient expressed understanding.   Education regarding symptom management and diagnosis given to patient on AVS either electronically or printed.  No follow-ups on file.  Rennie Plowman, FNP  Subjective:    Patient ID: Julia Nolan, female    DOB: 1980/12/09, 43 y.o.   MRN: [id number]  CC: Julia Nolan is a 43 y.o. female who presents today for physical exam.    HPI: Feels well today.  No new complaints.  Anxiety has improved.  She is compliant with Zoloft 50 mg daily and feels medication is working well for her.  She has not had to use Atarax    No family history of colon cancer Breast Cancer Screening: Mammogram UTD Cervical Cancer Screening: due; obtained 02/04/2019, negative malignancy, negative HPV        Tetanus - due        Exercise: Gets regular exercise, walking 3-4 days per week, pilates 3-4 days per week.    Alcohol use:  very occassional Smoking/tobacco use: Nonsmoker.    Health Maintenance  Topic Date Due   HIV Screening  Never done   Hepatitis C Screening  Never done   COVID-19 Vaccine (3 - Moderna risk series) 11/19/2019   Pap with HPV screening  02/03/2022   DTaP/Tdap/Td vaccine (3 - Td or Tdap) 10/02/2033   Flu Shot  Completed   HPV Vaccine  Aged Out    ALLERGIES: Patient has no known allergies.  Current Outpatient Medications on File Prior  to Visit  Medication Sig Dispense Refill   estradiol (CLIMARA - DOSED IN MG/24 HR) 0.0375 mg/24hr patch Place 0.0375 mg onto the skin once a week.     hydrOXYzine (ATARAX) 10 MG tablet TAKE 1 TO 2 TABLETS BY MOUTH TWICE A DAY AS NEEDED FOR ANXIETY 360 tablet 1   ondansetron (ZOFRAN-ODT) 4 MG disintegrating tablet TAKE 1 TABLET BY MOUTH EVERY 8 HOURS AS NEEDED FOR NAUSEA AND VOMITING 30 tablet 1   progesterone (PROMETRIUM) 100 MG capsule Take 100 mg by mouth daily.     sertraline (ZOLOFT) 50 MG tablet Take 1 tablet (50 mg total) by mouth daily. 90 tablet 3   No current facility-administered medications on file prior to visit.    Review of Systems  Constitutional:  Negative for chills, fever and unexpected weight change.  HENT:  Negative for congestion.   Respiratory:  Negative for cough.   Cardiovascular:  Negative for chest pain, palpitations and leg swelling.  Gastrointestinal:  Negative for nausea and vomiting.  Musculoskeletal:  Negative for arthralgias and myalgias.  Skin:  Negative for rash.  Neurological:  Negative for headaches.  Hematological:  Negative for adenopathy.  Psychiatric/Behavioral:  Negative for confusion, sleep disturbance and suicidal ideas.       Objective:    BP 130/78   Pulse (!) 57   Temp 98.3 F (36.8 C) (Oral)  Ht 5\' 10"  (1.778 m)   Wt 190 lb 3.2 oz (86.3 kg)   LMP  (LMP Unknown)   SpO2 98%   BMI 27.29 kg/m   BP Readings from Last 3 Encounters:  10/03/23 130/78  09/06/23 130/88  01/24/23 119/81   Wt Readings from Last 3 Encounters:  10/03/23 190 lb 3.2 oz (86.3 kg)  09/06/23 193 lb 6.4 oz (87.7 kg)  01/24/23 201 lb 12.8 oz (91.5 kg)    Physical Exam Vitals reviewed.  Constitutional:      Appearance: Normal appearance. She is well-developed.  Eyes:     Conjunctiva/sclera: Conjunctivae normal.  Neck:     Thyroid: No thyroid mass or thyromegaly.  Cardiovascular:     Rate and Rhythm: Normal rate and regular rhythm.     Pulses: Normal  pulses.     Heart sounds: Normal heart sounds.  Pulmonary:     Effort: Pulmonary effort is normal.     Breath sounds: Normal breath sounds. No wheezing, rhonchi or rales.  Chest:  Breasts:    Breasts are symmetrical.     Right: No inverted nipple, mass, nipple discharge, skin change or tenderness.     Left: No inverted nipple, mass, nipple discharge, skin change or tenderness.  Abdominal:     General: Bowel sounds are normal. There is no distension.     Palpations: Abdomen is soft. Abdomen is not rigid. There is no fluid wave or mass.     Tenderness: There is no abdominal tenderness. There is no guarding or rebound.  Genitourinary:    Cervix: No cervical motion tenderness, discharge or friability.     Uterus: Not enlarged, not fixed and not tender.      Adnexa:        Right: No mass, tenderness or fullness.         Left: No mass, tenderness or fullness.       Comments: Pap performed. No CMT. Unable to appreciated ovaries. Lymphadenopathy:     Head:     Right side of head: No submental, submandibular, tonsillar, preauricular, posterior auricular or occipital adenopathy.     Left side of head: No submental, submandibular, tonsillar, preauricular, posterior auricular or occipital adenopathy.     Cervical:     Right cervical: No superficial, deep or posterior cervical adenopathy.    Left cervical: No superficial, deep or posterior cervical adenopathy.     Upper Body:     Right upper body: No pectoral adenopathy.     Left upper body: No pectoral adenopathy.  Skin:    General: Skin is warm and dry.  Neurological:     Mental Status: She is alert.  Psychiatric:        Speech: Speech normal.        Behavior: Behavior normal.        Thought Content: Thought content normal.

## 2023-10-03 NOTE — Patient Instructions (Signed)

## 2023-10-03 NOTE — Assessment & Plan Note (Signed)
 Clinical breast exam performed.  Pap smear obtained.  Congratulated patient on diligence to exercise.  Tdap given

## 2023-10-03 NOTE — Assessment & Plan Note (Signed)
 Chronic, stable.  Continue Zoloft 50 mg daily, Atarax 10 mg as needed

## 2023-10-05 ENCOUNTER — Encounter: Payer: Self-pay | Admitting: Family

## 2023-10-05 LAB — CYTOLOGY - PAP
Comment: NEGATIVE
Diagnosis: NEGATIVE
High risk HPV: NEGATIVE

## 2024-01-01 ENCOUNTER — Other Ambulatory Visit: Payer: Self-pay | Admitting: Family

## 2024-01-01 DIAGNOSIS — L538 Other specified erythematous conditions: Secondary | ICD-10-CM | POA: Diagnosis not present

## 2024-01-01 DIAGNOSIS — D225 Melanocytic nevi of trunk: Secondary | ICD-10-CM | POA: Diagnosis not present

## 2024-01-01 DIAGNOSIS — R208 Other disturbances of skin sensation: Secondary | ICD-10-CM | POA: Diagnosis not present

## 2024-01-01 DIAGNOSIS — L2989 Other pruritus: Secondary | ICD-10-CM | POA: Diagnosis not present

## 2024-01-01 DIAGNOSIS — D485 Neoplasm of uncertain behavior of skin: Secondary | ICD-10-CM | POA: Diagnosis not present

## 2024-01-01 DIAGNOSIS — Z1231 Encounter for screening mammogram for malignant neoplasm of breast: Secondary | ICD-10-CM

## 2024-01-01 DIAGNOSIS — L820 Inflamed seborrheic keratosis: Secondary | ICD-10-CM | POA: Diagnosis not present

## 2024-01-03 ENCOUNTER — Ambulatory Visit
Admission: RE | Admit: 2024-01-03 | Discharge: 2024-01-03 | Disposition: A | Discharge status: Home / Self Care | Source: Ambulatory Visit

## 2024-01-03 DIAGNOSIS — Z1231 Encounter for screening mammogram for malignant neoplasm of breast: Secondary | ICD-10-CM | POA: Diagnosis not present

## 2024-02-13 ENCOUNTER — Ambulatory Visit
Admission: EM | Admit: 2024-02-13 | Discharge: 2024-02-13 | Disposition: A | Discharge status: Home / Self Care | Attending: Emergency Medicine | Admitting: Emergency Medicine

## 2024-02-13 DIAGNOSIS — J069 Acute upper respiratory infection, unspecified: Secondary | ICD-10-CM

## 2024-02-13 LAB — POC SARS CORONAVIRUS 2 AG -  ED: SARS Coronavirus 2 Ag: NEGATIVE

## 2024-02-13 MED ORDER — PREDNISONE 10 MG (21) PO TBPK: ORAL_TABLET | Freq: Every day | ORAL | 0 refills | Status: AC

## 2024-02-13 MED ORDER — AMOXICILLIN-POT CLAVULANATE 875-125 MG PO TABS: 1.0000 | ORAL_TABLET | Freq: Two times a day (BID) | ORAL | 0 refills | Status: AC

## 2024-02-13 MED ORDER — IPRATROPIUM BROMIDE 0.03 % NA SOLN: 2.0000 | Freq: Two times a day (BID) | NASAL | 0 refills | Status: AC

## 2024-02-13 NOTE — ED Triage Notes (Signed)
 Patient to Urgent Care with complaints of eye drainage/ nasal and head congestion/ facial pain/ ear ringing/ dry cough. Denies any fevers.   Symptoms started Sunday.   Meds: sudafed/ tylenol/ ibuprofen.

## 2024-02-13 NOTE — ED Provider Notes (Signed)
 Julia Nolan    CSN: [id number] Arrival date & time: 02/13/24  0941      History   Chief Complaint Chief Complaint  Patient presents with   Nasal Congestion    HPI Julia Nolan is a 43 y.o. female.   Patient presents for evaluation of nasal and chest congestion, bilateral ear fullness and pain described as a throbbing, intermittent headaches, sinus pressure behind the bilateral eyes, sore throat, nonproductive cough and diarrhea present for 3 days.  Diarrhea has resolved.  Known sick contact in household with similar symptoms.  Has not taken Tylenol, ibuprofen and pseudoephedrine.  Tolerating food and liquids.  Denies fever, shortness of breath or wheezing  Past Medical History:  Diagnosis Date   Anxiety    Depression    Factor 5 Leiden mutation, heterozygous [facility])     Patient Active Problem List   Diagnosis Date Noted   Hormone replacement therapy (HRT) 09/06/2023   Cough 05/19/2022   Endometriosis 07/15/2019   Routine physical examination 02/04/2019   Factor V Leiden mutation (HCC) 08/08/2018   GAD (generalized anxiety disorder) 08/08/2018    Past Surgical History:  Procedure Laterality Date   INTRAUTERINE DEVICE (IUD) INSERTION      OB History     Gravida  3   Para  3   Term  3   Preterm  0   AB  0   Living         SAB  0   IAB  0   Ectopic  0   Multiple      Live Births               Home Medications    Prior to Admission medications   Medication Sig Start Date End Date Taking? Authorizing Provider  amoxicillin -clavulanate (AUGMENTIN ) 875-125 MG tablet Take 1 tablet by mouth every 12 (twelve) hours. 02/18/24  Yes Breanne Olvera R, NP  ipratropium (ATROVENT ) 0.03 % nasal spray Place 2 sprays into both nostrils every 12 (twelve) hours. 02/13/24  Yes Latona Krichbaum R, NP  predniSONE  (STERAPRED UNI-PAK 21 TAB) 10 MG (21) TBPK tablet Take by mouth daily. Take 6 tabs by mouth daily  for 1 days, then 5 tabs for 1 days, then 4 tabs  for 1 days, then 3 tabs for 1 days, 2 tabs for 1 days, then 1 tab by mouth daily for 1 days 02/13/24  Yes Tamica Covell R, NP  estradiol (CLIMARA - DOSED IN MG/24 HR) 0.0375 mg/24hr patch Place 0.0375 mg onto the skin once a week.    [provider]  hydrOXYzine  (ATARAX ) 10 MG tablet TAKE 1 TO 2 TABLETS BY MOUTH TWICE A DAY AS NEEDED FOR ANXIETY 09/29/23   Dineen Rollene MATSU, FNP  ondansetron  (ZOFRAN -ODT) 4 MG disintegrating tablet TAKE 1 TABLET BY MOUTH EVERY 8 HOURS AS NEEDED FOR NAUSEA AND VOMITING 09/06/23   Dineen Rollene MATSU, FNP  progesterone (PROMETRIUM) 100 MG capsule Take 100 mg by mouth daily.    [provider]  sertraline  (ZOLOFT ) 50 MG tablet Take 1 tablet (50 mg total) by mouth daily. 09/06/23   Dineen Rollene MATSU, FNP    Family History Family History  Problem Relation Age of Onset   Depression Mother    Hyperlipidemia Mother    Endometriosis Mother    Hyperlipidemia Father    Depression Father    Prostate cancer Father    Bipolar disorder Sister    Thyroid  cancer Sister    Stroke Maternal  Grandmother    Endometriosis Maternal Grandmother    Arthritis Maternal Grandfather    Diabetes Maternal Grandfather    Hyperlipidemia Paternal Grandmother    Hyperlipidemia Paternal Grandfather    Hypertension Paternal Grandfather    Kidney disease Paternal Grandfather    Colon cancer Neg Hx    Breast cancer Neg Hx    BRCA 1/2 Neg Hx     Social History Social History   Tobacco Use   Smoking status: Never   Smokeless tobacco: Never  Vaping Use   Vaping status: Never Used  Substance Use Topics   Alcohol use: Yes    Comment: very occassional   Drug use: Never     Allergies   Patient has no known allergies.   Review of Systems Review of Systems   Physical Exam Triage Vital Signs ED Triage Vitals  Encounter Vitals Group     BP 02/13/24 0957 130/75     Girls Systolic BP Percentile --      Girls Diastolic BP Percentile --      Boys Systolic BP  Percentile --      Boys Diastolic BP Percentile --      Pulse Rate 02/13/24 0957 76     Resp 02/13/24 0957 18     Temp 02/13/24 0957 97.8 F (36.6 C)     Temp src --      SpO2 02/13/24 0957 97 %     Weight --      Height --      Head Circumference --      Peak Flow --      Pain Score 02/13/24 0958 5     Pain Loc --      Pain Education --      Exclude from Growth Chart --    No data found.  Updated Vital Signs BP 130/75   Pulse 76   Temp 97.8 F (36.6 C)   Resp 18   LMP 02/07/2024   SpO2 97%   Visual Acuity Right Eye Distance:   Left Eye Distance:   Bilateral Distance:    Right Eye Near:   Left Eye Near:    Bilateral Near:     Physical Exam Constitutional:      Appearance: Normal appearance.  HENT:     Head: Normocephalic.     Right Ear: Tympanic membrane, ear canal and external ear normal.     Left Ear: Tympanic membrane, ear canal and external ear normal.     Nose: Congestion present.     Mouth/Throat:     Mouth: Mucous membranes are moist.     Pharynx: Oropharynx is clear.  Eyes:     Extraocular Movements: Extraocular movements intact.  Cardiovascular:     Rate and Rhythm: Normal rate and regular rhythm.     Pulses: Normal pulses.     Heart sounds: Normal heart sounds.  Pulmonary:     Effort: Pulmonary effort is normal.     Breath sounds: Normal breath sounds.  Musculoskeletal:     Cervical back: Normal range of motion and neck supple.  Neurological:     Mental Status: She is alert and oriented to person, place, and time. Mental status is at baseline.      UC Treatments / Results  Labs (all labs ordered are listed, but only abnormal results are displayed) Labs Reviewed  POC SARS CORONAVIRUS 2 AG -  ED    EKG   Radiology No results found.  Procedures Procedures (  including critical care time)  Medications Ordered in UC Medications - No data to display  Initial Impression / Assessment and Plan / UC Course  I have reviewed the triage  vital signs and the nursing notes.  Pertinent labs & imaging results that were available during my care of the patient were reviewed by me and considered in my medical decision making (see chart for details).  Viral URI with cough  Patient is in no signs of distress nor toxic appearing.  Vital signs are stable.  Low suspicion for pneumonia, pneumothorax or bronchitis and therefore will defer imaging.  Test negative.  Prescribed prednisone  and Atrovent  nasal spray, watch and wait antibiotic placed at pharmacy.May use additional over-the-counter medications as needed for supportive care.  May follow-up with urgent care as needed if symptoms persist or worsen.   Final Clinical Impressions(s) / UC Diagnoses   Final diagnoses:  Viral URI with cough     Discharge Instructions      Your symptoms today are most likely being caused by a virus and should steadily improve in time it can take up to 7 to 10 days before you truly start to see a turnaround however things will get better, if no improvement by Sunday may pick up Augmentin  from the pharmacy  In the meantime begin prednisone  with food as directed to reduce sinus pressure with use of ibuprofen during treatment  May use nasal spray twice daily to further help clear out the sinuses    You can take Tylenol as needed for fever reduction and pain relief.   For cough: honey 1/2 to 1 teaspoon (you can dilute the honey in water or another fluid).  You can also use guaifenesin and dextromethorphan for cough. You can use a humidifier for chest congestion and cough.  If you don't have a humidifier, you can sit in the bathroom with the hot shower running.      For sore throat: try warm salt water gargles, cepacol lozenges, throat spray, warm tea or water with lemon/honey, popsicles or ice, or OTC cold relief medicine for throat discomfort.   For congestion: take a daily anti-histamine like Zyrtec, Claritin, and a oral decongestant, such as  pseudoephedrine.  You can also use Flonase 1-2 sprays in each nostril daily.   It is important to stay hydrated: drink plenty of fluids (water, gatorade/powerade/pedialyte, juices, or teas) to keep your throat moisturized and help further relieve irritation/discomfort.     ED Prescriptions     Medication Sig Dispense Auth. Provider   predniSONE  (STERAPRED UNI-PAK 21 TAB) 10 MG (21) TBPK tablet Take by mouth daily. Take 6 tabs by mouth daily  for 1 days, then 5 tabs for 1 days, then 4 tabs for 1 days, then 3 tabs for 1 days, 2 tabs for 1 days, then 1 tab by mouth daily for 1 days 21 tablet Cesareo Vickrey R, NP   ipratropium (ATROVENT ) 0.03 % nasal spray Place 2 sprays into both nostrils every 12 (twelve) hours. 30 mL Benicia Bergevin R, NP   amoxicillin -clavulanate (AUGMENTIN ) 875-125 MG tablet Take 1 tablet by mouth every 12 (twelve) hours. 14 tablet Mehgan Santmyer R, NP      PDMP not reviewed this encounter.   Teresa Shelba SAUNDERS, NP 02/13/24 1047

## 2024-02-13 NOTE — Discharge Instructions (Signed)
 Your symptoms today are most likely being caused by a virus and should steadily improve in time it can take up to 7 to 10 days before you truly start to see a turnaround however things will get better, if no improvement by Sunday may pick up Augmentin  from the pharmacy  In the meantime begin prednisone  with food as directed to reduce sinus pressure with use of ibuprofen during treatment  May use nasal spray twice daily to further help clear out the sinuses    You can take Tylenol as needed for fever reduction and pain relief.   For cough: honey 1/2 to 1 teaspoon (you can dilute the honey in water or another fluid).  You can also use guaifenesin and dextromethorphan for cough. You can use a humidifier for chest congestion and cough.  If you don't have a humidifier, you can sit in the bathroom with the hot shower running.      For sore throat: try warm salt water gargles, cepacol lozenges, throat spray, warm tea or water with lemon/honey, popsicles or ice, or OTC cold relief medicine for throat discomfort.   For congestion: take a daily anti-histamine like Zyrtec, Claritin, and a oral decongestant, such as pseudoephedrine.  You can also use Flonase 1-2 sprays in each nostril daily.   It is important to stay hydrated: drink plenty of fluids (water, gatorade/powerade/pedialyte, juices, or teas) to keep your throat moisturized and help further relieve irritation/discomfort.

## 2024-03-06 DIAGNOSIS — D179 Benign lipomatous neoplasm, unspecified: Secondary | ICD-10-CM | POA: Diagnosis not present

## 2024-03-06 DIAGNOSIS — L989 Disorder of the skin and subcutaneous tissue, unspecified: Secondary | ICD-10-CM | POA: Diagnosis not present

## 2024-04-05 DIAGNOSIS — M7701 Medial epicondylitis, right elbow: Secondary | ICD-10-CM | POA: Diagnosis not present

## 2024-04-05 DIAGNOSIS — M7711 Lateral epicondylitis, right elbow: Secondary | ICD-10-CM | POA: Diagnosis not present

## 2024-08-20 ENCOUNTER — Other Ambulatory Visit: Payer: Self-pay | Admitting: Family

## 2024-08-20 DIAGNOSIS — F411 Generalized anxiety disorder: Secondary | ICD-10-CM

## 2024-08-20 NOTE — Telephone Encounter (Signed)
 Called pt unable to LVM due to not being set up, pt has not been seen in office since 09/2023. Ok to schedule when pt calls back to continue getting refills

## 2024-08-28 ENCOUNTER — Encounter: Payer: Self-pay | Admitting: Obstetrics and Gynecology

## 2024-08-29 ENCOUNTER — Encounter: Payer: Self-pay | Admitting: Family

## 2024-08-30 ENCOUNTER — Other Ambulatory Visit: Payer: Self-pay | Admitting: Family

## 2024-08-30 DIAGNOSIS — Z23 Encounter for immunization: Secondary | ICD-10-CM

## 2024-09-06 ENCOUNTER — Ambulatory Visit: Admitting: Family
